# Patient Record
Sex: Female | Born: 1945
Health system: Southern US, Community
[De-identification: ages and names within clinical notes are randomized; demographics above are authoritative.]

## PROBLEM LIST (undated history)

## (undated) DIAGNOSIS — E785 Hyperlipidemia, unspecified: Secondary | ICD-10-CM

## (undated) DIAGNOSIS — E079 Disorder of thyroid, unspecified: Secondary | ICD-10-CM

## (undated) DIAGNOSIS — I219 Acute myocardial infarction, unspecified: Secondary | ICD-10-CM

## (undated) DIAGNOSIS — I10 Essential (primary) hypertension: Secondary | ICD-10-CM

## (undated) DIAGNOSIS — T7840XA Allergy, unspecified, initial encounter: Secondary | ICD-10-CM

## (undated) HISTORY — PX: CHOLECYSTECTOMY: SHX55

## (undated) HISTORY — DX: Acute myocardial infarction, unspecified: I21.9

## (undated) HISTORY — PX: CARDIAC DEFIBRILLATOR PLACEMENT: SHX171

## (undated) HISTORY — DX: Essential (primary) hypertension: I10

## (undated) HISTORY — PX: APPENDECTOMY: SHX54

## (undated) HISTORY — DX: Hyperlipidemia, unspecified: E78.5

## (undated) HISTORY — DX: Disorder of thyroid, unspecified: E07.9

## (undated) HISTORY — PX: CORONARY ARTERY BYPASS GRAFT: SHX141

## (undated) HISTORY — DX: Allergy, unspecified, initial encounter: T78.40XA

## (undated) HISTORY — PX: ABDOMINAL HYSTERECTOMY: SHX81

---

## 2014-11-17 DIAGNOSIS — I209 Angina pectoris, unspecified: Secondary | ICD-10-CM | POA: Diagnosis not present

## 2014-11-17 DIAGNOSIS — I252 Old myocardial infarction: Secondary | ICD-10-CM | POA: Diagnosis not present

## 2014-11-17 DIAGNOSIS — I5033 Acute on chronic diastolic (congestive) heart failure: Secondary | ICD-10-CM | POA: Diagnosis not present

## 2014-11-17 DIAGNOSIS — I2510 Atherosclerotic heart disease of native coronary artery without angina pectoris: Secondary | ICD-10-CM | POA: Diagnosis not present

## 2014-11-17 DIAGNOSIS — R0789 Other chest pain: Secondary | ICD-10-CM | POA: Diagnosis not present

## 2014-11-17 DIAGNOSIS — J9811 Atelectasis: Secondary | ICD-10-CM | POA: Diagnosis not present

## 2014-11-17 DIAGNOSIS — I5042 Chronic combined systolic (congestive) and diastolic (congestive) heart failure: Secondary | ICD-10-CM | POA: Diagnosis not present

## 2014-11-17 DIAGNOSIS — I10 Essential (primary) hypertension: Secondary | ICD-10-CM | POA: Diagnosis not present

## 2014-11-17 DIAGNOSIS — Z885 Allergy status to narcotic agent status: Secondary | ICD-10-CM | POA: Diagnosis not present

## 2014-11-17 DIAGNOSIS — Z955 Presence of coronary angioplasty implant and graft: Secondary | ICD-10-CM | POA: Diagnosis not present

## 2014-11-17 DIAGNOSIS — E669 Obesity, unspecified: Secondary | ICD-10-CM | POA: Diagnosis not present

## 2014-11-17 DIAGNOSIS — R110 Nausea: Secondary | ICD-10-CM | POA: Diagnosis not present

## 2014-11-17 DIAGNOSIS — R0602 Shortness of breath: Secondary | ICD-10-CM | POA: Diagnosis not present

## 2014-11-17 DIAGNOSIS — Z87891 Personal history of nicotine dependence: Secondary | ICD-10-CM | POA: Diagnosis not present

## 2014-11-17 DIAGNOSIS — Z7982 Long term (current) use of aspirin: Secondary | ICD-10-CM | POA: Diagnosis not present

## 2014-11-17 DIAGNOSIS — I25119 Atherosclerotic heart disease of native coronary artery with unspecified angina pectoris: Secondary | ICD-10-CM | POA: Diagnosis not present

## 2014-11-17 DIAGNOSIS — K219 Gastro-esophageal reflux disease without esophagitis: Secondary | ICD-10-CM | POA: Diagnosis not present

## 2014-11-18 DIAGNOSIS — I2510 Atherosclerotic heart disease of native coronary artery without angina pectoris: Secondary | ICD-10-CM | POA: Diagnosis not present

## 2014-11-18 DIAGNOSIS — I5042 Chronic combined systolic (congestive) and diastolic (congestive) heart failure: Secondary | ICD-10-CM | POA: Diagnosis not present

## 2014-11-18 DIAGNOSIS — E669 Obesity, unspecified: Secondary | ICD-10-CM | POA: Diagnosis not present

## 2014-11-18 DIAGNOSIS — I10 Essential (primary) hypertension: Secondary | ICD-10-CM | POA: Diagnosis not present

## 2014-11-18 DIAGNOSIS — K219 Gastro-esophageal reflux disease without esophagitis: Secondary | ICD-10-CM | POA: Diagnosis not present

## 2014-11-18 DIAGNOSIS — Z7982 Long term (current) use of aspirin: Secondary | ICD-10-CM | POA: Diagnosis not present

## 2014-11-18 DIAGNOSIS — Z955 Presence of coronary angioplasty implant and graft: Secondary | ICD-10-CM | POA: Diagnosis not present

## 2014-11-18 DIAGNOSIS — Z885 Allergy status to narcotic agent status: Secondary | ICD-10-CM | POA: Diagnosis not present

## 2014-11-18 DIAGNOSIS — I25119 Atherosclerotic heart disease of native coronary artery with unspecified angina pectoris: Secondary | ICD-10-CM | POA: Diagnosis not present

## 2014-11-18 DIAGNOSIS — R0789 Other chest pain: Secondary | ICD-10-CM | POA: Diagnosis not present

## 2014-11-18 DIAGNOSIS — Z87891 Personal history of nicotine dependence: Secondary | ICD-10-CM | POA: Diagnosis not present

## 2014-11-18 DIAGNOSIS — I252 Old myocardial infarction: Secondary | ICD-10-CM | POA: Diagnosis not present

## 2014-11-18 DIAGNOSIS — R0600 Dyspnea, unspecified: Secondary | ICD-10-CM | POA: Diagnosis not present

## 2014-11-18 DIAGNOSIS — E039 Hypothyroidism, unspecified: Secondary | ICD-10-CM | POA: Diagnosis not present

## 2014-11-19 DIAGNOSIS — Z7982 Long term (current) use of aspirin: Secondary | ICD-10-CM | POA: Diagnosis not present

## 2014-11-19 DIAGNOSIS — I5042 Chronic combined systolic (congestive) and diastolic (congestive) heart failure: Secondary | ICD-10-CM | POA: Diagnosis not present

## 2014-11-19 DIAGNOSIS — E669 Obesity, unspecified: Secondary | ICD-10-CM | POA: Diagnosis not present

## 2014-11-19 DIAGNOSIS — I2510 Atherosclerotic heart disease of native coronary artery without angina pectoris: Secondary | ICD-10-CM | POA: Diagnosis not present

## 2014-11-19 DIAGNOSIS — I25119 Atherosclerotic heart disease of native coronary artery with unspecified angina pectoris: Secondary | ICD-10-CM | POA: Diagnosis not present

## 2014-11-19 DIAGNOSIS — R072 Precordial pain: Secondary | ICD-10-CM | POA: Diagnosis not present

## 2014-11-19 DIAGNOSIS — Z885 Allergy status to narcotic agent status: Secondary | ICD-10-CM | POA: Diagnosis not present

## 2014-11-19 DIAGNOSIS — Z87891 Personal history of nicotine dependence: Secondary | ICD-10-CM | POA: Diagnosis not present

## 2014-11-19 DIAGNOSIS — K219 Gastro-esophageal reflux disease without esophagitis: Secondary | ICD-10-CM | POA: Diagnosis not present

## 2014-11-19 DIAGNOSIS — I252 Old myocardial infarction: Secondary | ICD-10-CM | POA: Diagnosis not present

## 2014-11-19 DIAGNOSIS — Z955 Presence of coronary angioplasty implant and graft: Secondary | ICD-10-CM | POA: Diagnosis not present

## 2014-12-20 DIAGNOSIS — H52209 Unspecified astigmatism, unspecified eye: Secondary | ICD-10-CM | POA: Diagnosis not present

## 2014-12-20 DIAGNOSIS — Z95810 Presence of automatic (implantable) cardiac defibrillator: Secondary | ICD-10-CM | POA: Diagnosis not present

## 2014-12-20 DIAGNOSIS — Z0100 Encounter for examination of eyes and vision without abnormal findings: Secondary | ICD-10-CM | POA: Diagnosis not present

## 2014-12-20 DIAGNOSIS — H524 Presbyopia: Secondary | ICD-10-CM | POA: Diagnosis not present

## 2014-12-20 DIAGNOSIS — I429 Cardiomyopathy, unspecified: Secondary | ICD-10-CM | POA: Diagnosis not present

## 2014-12-20 DIAGNOSIS — H5203 Hypermetropia, bilateral: Secondary | ICD-10-CM | POA: Diagnosis not present

## 2014-12-20 DIAGNOSIS — H5210 Myopia, unspecified eye: Secondary | ICD-10-CM | POA: Diagnosis not present

## 2014-12-21 DIAGNOSIS — J441 Chronic obstructive pulmonary disease with (acute) exacerbation: Secondary | ICD-10-CM | POA: Diagnosis not present

## 2014-12-21 DIAGNOSIS — Z6835 Body mass index (BMI) 35.0-35.9, adult: Secondary | ICD-10-CM | POA: Diagnosis not present

## 2014-12-21 DIAGNOSIS — R05 Cough: Secondary | ICD-10-CM | POA: Diagnosis not present

## 2014-12-24 DIAGNOSIS — J449 Chronic obstructive pulmonary disease, unspecified: Secondary | ICD-10-CM | POA: Diagnosis not present

## 2014-12-24 DIAGNOSIS — Z6835 Body mass index (BMI) 35.0-35.9, adult: Secondary | ICD-10-CM | POA: Diagnosis not present

## 2015-01-03 DIAGNOSIS — J449 Chronic obstructive pulmonary disease, unspecified: Secondary | ICD-10-CM | POA: Diagnosis not present

## 2015-01-03 DIAGNOSIS — E039 Hypothyroidism, unspecified: Secondary | ICD-10-CM | POA: Diagnosis not present

## 2015-01-03 DIAGNOSIS — Z6835 Body mass index (BMI) 35.0-35.9, adult: Secondary | ICD-10-CM | POA: Diagnosis not present

## 2015-01-03 DIAGNOSIS — E780 Pure hypercholesterolemia: Secondary | ICD-10-CM | POA: Diagnosis not present

## 2015-01-03 DIAGNOSIS — I10 Essential (primary) hypertension: Secondary | ICD-10-CM | POA: Diagnosis not present

## 2015-01-05 DIAGNOSIS — K219 Gastro-esophageal reflux disease without esophagitis: Secondary | ICD-10-CM | POA: Diagnosis not present

## 2015-01-05 DIAGNOSIS — J449 Chronic obstructive pulmonary disease, unspecified: Secondary | ICD-10-CM | POA: Diagnosis not present

## 2015-01-05 DIAGNOSIS — I252 Old myocardial infarction: Secondary | ICD-10-CM | POA: Diagnosis not present

## 2015-01-05 DIAGNOSIS — E785 Hyperlipidemia, unspecified: Secondary | ICD-10-CM | POA: Diagnosis not present

## 2015-01-05 DIAGNOSIS — I2510 Atherosclerotic heart disease of native coronary artery without angina pectoris: Secondary | ICD-10-CM | POA: Diagnosis not present

## 2015-01-05 DIAGNOSIS — R1013 Epigastric pain: Secondary | ICD-10-CM | POA: Diagnosis not present

## 2015-01-05 DIAGNOSIS — I739 Peripheral vascular disease, unspecified: Secondary | ICD-10-CM | POA: Diagnosis not present

## 2015-01-05 DIAGNOSIS — R079 Chest pain, unspecified: Secondary | ICD-10-CM | POA: Diagnosis not present

## 2015-01-05 DIAGNOSIS — R05 Cough: Secondary | ICD-10-CM | POA: Diagnosis not present

## 2015-01-05 DIAGNOSIS — I10 Essential (primary) hypertension: Secondary | ICD-10-CM | POA: Diagnosis not present

## 2015-01-05 DIAGNOSIS — R110 Nausea: Secondary | ICD-10-CM | POA: Diagnosis not present

## 2015-01-05 DIAGNOSIS — E669 Obesity, unspecified: Secondary | ICD-10-CM | POA: Diagnosis not present

## 2015-01-06 DIAGNOSIS — K219 Gastro-esophageal reflux disease without esophagitis: Secondary | ICD-10-CM | POA: Diagnosis not present

## 2015-01-06 DIAGNOSIS — R079 Chest pain, unspecified: Secondary | ICD-10-CM | POA: Diagnosis not present

## 2015-01-06 DIAGNOSIS — I252 Old myocardial infarction: Secondary | ICD-10-CM | POA: Diagnosis not present

## 2015-01-06 DIAGNOSIS — R05 Cough: Secondary | ICD-10-CM | POA: Diagnosis not present

## 2015-01-06 DIAGNOSIS — I2510 Atherosclerotic heart disease of native coronary artery without angina pectoris: Secondary | ICD-10-CM | POA: Diagnosis not present

## 2015-01-06 DIAGNOSIS — E785 Hyperlipidemia, unspecified: Secondary | ICD-10-CM | POA: Diagnosis not present

## 2015-01-06 DIAGNOSIS — R1013 Epigastric pain: Secondary | ICD-10-CM | POA: Diagnosis not present

## 2015-01-06 DIAGNOSIS — I10 Essential (primary) hypertension: Secondary | ICD-10-CM | POA: Diagnosis not present

## 2015-01-06 DIAGNOSIS — I739 Peripheral vascular disease, unspecified: Secondary | ICD-10-CM | POA: Diagnosis not present

## 2015-01-06 DIAGNOSIS — J449 Chronic obstructive pulmonary disease, unspecified: Secondary | ICD-10-CM | POA: Diagnosis not present

## 2015-01-06 DIAGNOSIS — E669 Obesity, unspecified: Secondary | ICD-10-CM | POA: Diagnosis not present

## 2015-03-17 DIAGNOSIS — Z6834 Body mass index (BMI) 34.0-34.9, adult: Secondary | ICD-10-CM | POA: Diagnosis not present

## 2015-03-17 DIAGNOSIS — I509 Heart failure, unspecified: Secondary | ICD-10-CM | POA: Diagnosis not present

## 2015-03-17 DIAGNOSIS — N183 Chronic kidney disease, stage 3 (moderate): Secondary | ICD-10-CM | POA: Diagnosis not present

## 2015-03-17 DIAGNOSIS — Z9861 Coronary angioplasty status: Secondary | ICD-10-CM | POA: Diagnosis not present

## 2015-03-17 DIAGNOSIS — E6609 Other obesity due to excess calories: Secondary | ICD-10-CM | POA: Diagnosis not present

## 2015-03-17 DIAGNOSIS — Z955 Presence of coronary angioplasty implant and graft: Secondary | ICD-10-CM | POA: Diagnosis not present

## 2015-03-23 DIAGNOSIS — Z95810 Presence of automatic (implantable) cardiac defibrillator: Secondary | ICD-10-CM | POA: Diagnosis not present

## 2015-03-23 DIAGNOSIS — I429 Cardiomyopathy, unspecified: Secondary | ICD-10-CM | POA: Diagnosis not present

## 2015-04-04 DIAGNOSIS — F419 Anxiety disorder, unspecified: Secondary | ICD-10-CM | POA: Diagnosis not present

## 2015-04-04 DIAGNOSIS — I739 Peripheral vascular disease, unspecified: Secondary | ICD-10-CM | POA: Diagnosis not present

## 2015-04-04 DIAGNOSIS — Z1389 Encounter for screening for other disorder: Secondary | ICD-10-CM | POA: Diagnosis not present

## 2015-04-04 DIAGNOSIS — G4700 Insomnia, unspecified: Secondary | ICD-10-CM | POA: Diagnosis not present

## 2015-04-04 DIAGNOSIS — E039 Hypothyroidism, unspecified: Secondary | ICD-10-CM | POA: Diagnosis not present

## 2015-04-04 DIAGNOSIS — I10 Essential (primary) hypertension: Secondary | ICD-10-CM | POA: Diagnosis not present

## 2015-04-04 DIAGNOSIS — J449 Chronic obstructive pulmonary disease, unspecified: Secondary | ICD-10-CM | POA: Diagnosis not present

## 2015-04-04 DIAGNOSIS — E780 Pure hypercholesterolemia: Secondary | ICD-10-CM | POA: Diagnosis not present

## 2015-06-23 DIAGNOSIS — Z95810 Presence of automatic (implantable) cardiac defibrillator: Secondary | ICD-10-CM | POA: Diagnosis not present

## 2015-06-23 DIAGNOSIS — I429 Cardiomyopathy, unspecified: Secondary | ICD-10-CM | POA: Diagnosis not present

## 2015-06-23 DIAGNOSIS — Z45018 Encounter for adjustment and management of other part of cardiac pacemaker: Secondary | ICD-10-CM | POA: Diagnosis not present

## 2015-06-30 DIAGNOSIS — Z9861 Coronary angioplasty status: Secondary | ICD-10-CM | POA: Diagnosis not present

## 2015-06-30 DIAGNOSIS — I083 Combined rheumatic disorders of mitral, aortic and tricuspid valves: Secondary | ICD-10-CM | POA: Diagnosis not present

## 2015-06-30 DIAGNOSIS — Z87891 Personal history of nicotine dependence: Secondary | ICD-10-CM | POA: Diagnosis not present

## 2015-06-30 DIAGNOSIS — R109 Unspecified abdominal pain: Secondary | ICD-10-CM | POA: Diagnosis not present

## 2015-06-30 DIAGNOSIS — N183 Chronic kidney disease, stage 3 (moderate): Secondary | ICD-10-CM | POA: Diagnosis not present

## 2015-06-30 DIAGNOSIS — I25119 Atherosclerotic heart disease of native coronary artery with unspecified angina pectoris: Secondary | ICD-10-CM | POA: Diagnosis not present

## 2015-06-30 DIAGNOSIS — I252 Old myocardial infarction: Secondary | ICD-10-CM | POA: Diagnosis not present

## 2015-06-30 DIAGNOSIS — Z7982 Long term (current) use of aspirin: Secondary | ICD-10-CM | POA: Diagnosis not present

## 2015-06-30 DIAGNOSIS — Z955 Presence of coronary angioplasty implant and graft: Secondary | ICD-10-CM | POA: Diagnosis not present

## 2015-06-30 DIAGNOSIS — I509 Heart failure, unspecified: Secondary | ICD-10-CM | POA: Diagnosis not present

## 2015-08-03 DIAGNOSIS — I739 Peripheral vascular disease, unspecified: Secondary | ICD-10-CM | POA: Diagnosis not present

## 2015-08-03 DIAGNOSIS — Z955 Presence of coronary angioplasty implant and graft: Secondary | ICD-10-CM | POA: Diagnosis not present

## 2015-08-03 DIAGNOSIS — Z95810 Presence of automatic (implantable) cardiac defibrillator: Secondary | ICD-10-CM | POA: Diagnosis not present

## 2015-08-03 DIAGNOSIS — Z1389 Encounter for screening for other disorder: Secondary | ICD-10-CM | POA: Diagnosis not present

## 2015-08-03 DIAGNOSIS — Z9181 History of falling: Secondary | ICD-10-CM | POA: Diagnosis not present

## 2015-08-03 DIAGNOSIS — I10 Essential (primary) hypertension: Secondary | ICD-10-CM | POA: Diagnosis not present

## 2015-08-03 DIAGNOSIS — J449 Chronic obstructive pulmonary disease, unspecified: Secondary | ICD-10-CM | POA: Diagnosis not present

## 2015-08-03 DIAGNOSIS — I2510 Atherosclerotic heart disease of native coronary artery without angina pectoris: Secondary | ICD-10-CM | POA: Diagnosis not present

## 2015-08-04 DIAGNOSIS — E780 Pure hypercholesterolemia: Secondary | ICD-10-CM | POA: Diagnosis not present

## 2015-08-04 DIAGNOSIS — E039 Hypothyroidism, unspecified: Secondary | ICD-10-CM | POA: Diagnosis not present

## 2015-09-23 DIAGNOSIS — Z95810 Presence of automatic (implantable) cardiac defibrillator: Secondary | ICD-10-CM | POA: Diagnosis not present

## 2015-09-23 DIAGNOSIS — I429 Cardiomyopathy, unspecified: Secondary | ICD-10-CM | POA: Diagnosis not present

## 2015-11-02 DIAGNOSIS — I2510 Atherosclerotic heart disease of native coronary artery without angina pectoris: Secondary | ICD-10-CM | POA: Diagnosis not present

## 2015-11-02 DIAGNOSIS — I10 Essential (primary) hypertension: Secondary | ICD-10-CM | POA: Diagnosis not present

## 2015-11-02 DIAGNOSIS — J449 Chronic obstructive pulmonary disease, unspecified: Secondary | ICD-10-CM | POA: Diagnosis not present

## 2015-11-02 DIAGNOSIS — E039 Hypothyroidism, unspecified: Secondary | ICD-10-CM | POA: Diagnosis not present

## 2015-11-02 DIAGNOSIS — Z6831 Body mass index (BMI) 31.0-31.9, adult: Secondary | ICD-10-CM | POA: Diagnosis not present

## 2015-12-22 DIAGNOSIS — Z95810 Presence of automatic (implantable) cardiac defibrillator: Secondary | ICD-10-CM | POA: Diagnosis not present

## 2015-12-22 DIAGNOSIS — I429 Cardiomyopathy, unspecified: Secondary | ICD-10-CM | POA: Diagnosis not present

## 2015-12-29 DIAGNOSIS — I519 Heart disease, unspecified: Secondary | ICD-10-CM | POA: Diagnosis not present

## 2015-12-29 DIAGNOSIS — Z87891 Personal history of nicotine dependence: Secondary | ICD-10-CM | POA: Diagnosis not present

## 2015-12-29 DIAGNOSIS — N183 Chronic kidney disease, stage 3 (moderate): Secondary | ICD-10-CM | POA: Diagnosis not present

## 2015-12-29 DIAGNOSIS — I110 Hypertensive heart disease with heart failure: Secondary | ICD-10-CM | POA: Diagnosis not present

## 2015-12-29 DIAGNOSIS — I252 Old myocardial infarction: Secondary | ICD-10-CM | POA: Diagnosis not present

## 2015-12-29 DIAGNOSIS — Z7982 Long term (current) use of aspirin: Secondary | ICD-10-CM | POA: Diagnosis not present

## 2015-12-29 DIAGNOSIS — I129 Hypertensive chronic kidney disease with stage 1 through stage 4 chronic kidney disease, or unspecified chronic kidney disease: Secondary | ICD-10-CM | POA: Diagnosis not present

## 2015-12-29 DIAGNOSIS — I10 Essential (primary) hypertension: Secondary | ICD-10-CM | POA: Diagnosis not present

## 2015-12-29 DIAGNOSIS — I2510 Atherosclerotic heart disease of native coronary artery without angina pectoris: Secondary | ICD-10-CM | POA: Diagnosis not present

## 2015-12-29 DIAGNOSIS — Z79899 Other long term (current) drug therapy: Secondary | ICD-10-CM | POA: Diagnosis not present

## 2015-12-29 DIAGNOSIS — I509 Heart failure, unspecified: Secondary | ICD-10-CM | POA: Diagnosis not present

## 2016-01-31 DIAGNOSIS — Z0000 Encounter for general adult medical examination without abnormal findings: Secondary | ICD-10-CM | POA: Diagnosis not present

## 2016-01-31 DIAGNOSIS — Z139 Encounter for screening, unspecified: Secondary | ICD-10-CM | POA: Diagnosis not present

## 2016-01-31 DIAGNOSIS — R7309 Other abnormal glucose: Secondary | ICD-10-CM | POA: Diagnosis not present

## 2016-01-31 DIAGNOSIS — Z6831 Body mass index (BMI) 31.0-31.9, adult: Secondary | ICD-10-CM | POA: Diagnosis not present

## 2016-01-31 DIAGNOSIS — Z9181 History of falling: Secondary | ICD-10-CM | POA: Diagnosis not present

## 2016-05-02 DIAGNOSIS — E784 Other hyperlipidemia: Secondary | ICD-10-CM | POA: Diagnosis not present

## 2016-05-02 DIAGNOSIS — E039 Hypothyroidism, unspecified: Secondary | ICD-10-CM | POA: Diagnosis not present

## 2016-05-02 DIAGNOSIS — I110 Hypertensive heart disease with heart failure: Secondary | ICD-10-CM | POA: Diagnosis not present

## 2016-05-02 DIAGNOSIS — K219 Gastro-esophageal reflux disease without esophagitis: Secondary | ICD-10-CM | POA: Diagnosis not present

## 2016-06-19 DIAGNOSIS — I110 Hypertensive heart disease with heart failure: Secondary | ICD-10-CM | POA: Diagnosis not present

## 2016-06-19 DIAGNOSIS — E784 Other hyperlipidemia: Secondary | ICD-10-CM | POA: Diagnosis not present

## 2016-06-19 DIAGNOSIS — Z1389 Encounter for screening for other disorder: Secondary | ICD-10-CM | POA: Diagnosis not present

## 2016-06-19 DIAGNOSIS — Z130 Encounter for screening for diseases of the blood and blood-forming organs and certain disorders involving the immune mechanism: Secondary | ICD-10-CM | POA: Diagnosis not present

## 2016-06-19 DIAGNOSIS — E039 Hypothyroidism, unspecified: Secondary | ICD-10-CM | POA: Diagnosis not present

## 2016-06-21 DIAGNOSIS — I5022 Chronic systolic (congestive) heart failure: Secondary | ICD-10-CM | POA: Diagnosis not present

## 2016-07-03 DIAGNOSIS — Z139 Encounter for screening, unspecified: Secondary | ICD-10-CM | POA: Diagnosis not present

## 2016-07-03 DIAGNOSIS — I110 Hypertensive heart disease with heart failure: Secondary | ICD-10-CM | POA: Diagnosis not present

## 2016-07-03 DIAGNOSIS — E039 Hypothyroidism, unspecified: Secondary | ICD-10-CM | POA: Diagnosis not present

## 2016-07-03 DIAGNOSIS — K5909 Other constipation: Secondary | ICD-10-CM | POA: Diagnosis not present

## 2016-07-03 DIAGNOSIS — Z1389 Encounter for screening for other disorder: Secondary | ICD-10-CM | POA: Diagnosis not present

## 2016-07-03 DIAGNOSIS — E784 Other hyperlipidemia: Secondary | ICD-10-CM | POA: Diagnosis not present

## 2016-07-03 DIAGNOSIS — Z0000 Encounter for general adult medical examination without abnormal findings: Secondary | ICD-10-CM | POA: Diagnosis not present

## 2016-08-09 DIAGNOSIS — Z955 Presence of coronary angioplasty implant and graft: Secondary | ICD-10-CM | POA: Diagnosis not present

## 2016-10-17 DIAGNOSIS — E039 Hypothyroidism, unspecified: Secondary | ICD-10-CM | POA: Diagnosis not present

## 2016-10-17 DIAGNOSIS — I110 Hypertensive heart disease with heart failure: Secondary | ICD-10-CM | POA: Diagnosis not present

## 2016-10-17 DIAGNOSIS — E784 Other hyperlipidemia: Secondary | ICD-10-CM | POA: Diagnosis not present

## 2016-10-23 DIAGNOSIS — I110 Hypertensive heart disease with heart failure: Secondary | ICD-10-CM | POA: Diagnosis not present

## 2016-10-23 DIAGNOSIS — E039 Hypothyroidism, unspecified: Secondary | ICD-10-CM | POA: Diagnosis not present

## 2016-10-23 DIAGNOSIS — Z6832 Body mass index (BMI) 32.0-32.9, adult: Secondary | ICD-10-CM | POA: Diagnosis not present

## 2016-10-23 DIAGNOSIS — E784 Other hyperlipidemia: Secondary | ICD-10-CM | POA: Diagnosis not present

## 2016-11-09 DIAGNOSIS — I429 Cardiomyopathy, unspecified: Secondary | ICD-10-CM | POA: Diagnosis not present

## 2016-11-09 DIAGNOSIS — Z95810 Presence of automatic (implantable) cardiac defibrillator: Secondary | ICD-10-CM | POA: Diagnosis not present

## 2016-11-09 DIAGNOSIS — I5022 Chronic systolic (congestive) heart failure: Secondary | ICD-10-CM | POA: Diagnosis not present

## 2017-02-08 DIAGNOSIS — I5022 Chronic systolic (congestive) heart failure: Secondary | ICD-10-CM | POA: Diagnosis not present

## 2017-04-23 DIAGNOSIS — R1012 Left upper quadrant pain: Secondary | ICD-10-CM | POA: Diagnosis not present

## 2017-04-23 DIAGNOSIS — K529 Noninfective gastroenteritis and colitis, unspecified: Secondary | ICD-10-CM | POA: Diagnosis not present

## 2017-04-23 DIAGNOSIS — I110 Hypertensive heart disease with heart failure: Secondary | ICD-10-CM | POA: Diagnosis not present

## 2017-04-23 DIAGNOSIS — I10 Essential (primary) hypertension: Secondary | ICD-10-CM | POA: Diagnosis not present

## 2017-04-23 DIAGNOSIS — R1110 Vomiting, unspecified: Secondary | ICD-10-CM | POA: Diagnosis not present

## 2017-04-23 DIAGNOSIS — Z955 Presence of coronary angioplasty implant and graft: Secondary | ICD-10-CM | POA: Diagnosis not present

## 2017-04-23 DIAGNOSIS — E784 Other hyperlipidemia: Secondary | ICD-10-CM | POA: Diagnosis not present

## 2017-04-23 DIAGNOSIS — Z79899 Other long term (current) drug therapy: Secondary | ICD-10-CM | POA: Diagnosis not present

## 2017-04-23 DIAGNOSIS — I252 Old myocardial infarction: Secondary | ICD-10-CM | POA: Diagnosis not present

## 2017-04-23 DIAGNOSIS — E039 Hypothyroidism, unspecified: Secondary | ICD-10-CM | POA: Diagnosis not present

## 2017-04-23 DIAGNOSIS — R197 Diarrhea, unspecified: Secondary | ICD-10-CM | POA: Diagnosis not present

## 2017-04-23 DIAGNOSIS — Z885 Allergy status to narcotic agent status: Secondary | ICD-10-CM | POA: Diagnosis not present

## 2017-04-23 DIAGNOSIS — R112 Nausea with vomiting, unspecified: Secondary | ICD-10-CM | POA: Diagnosis not present

## 2017-04-26 DIAGNOSIS — Z6832 Body mass index (BMI) 32.0-32.9, adult: Secondary | ICD-10-CM | POA: Diagnosis not present

## 2017-04-26 DIAGNOSIS — F3160 Bipolar disorder, current episode mixed, unspecified: Secondary | ICD-10-CM | POA: Diagnosis not present

## 2017-04-26 DIAGNOSIS — Z139 Encounter for screening, unspecified: Secondary | ICD-10-CM | POA: Diagnosis not present

## 2017-04-26 DIAGNOSIS — M79609 Pain in unspecified limb: Secondary | ICD-10-CM | POA: Diagnosis not present

## 2017-04-26 DIAGNOSIS — R109 Unspecified abdominal pain: Secondary | ICD-10-CM | POA: Diagnosis not present

## 2017-05-02 DIAGNOSIS — E039 Hypothyroidism, unspecified: Secondary | ICD-10-CM | POA: Diagnosis not present

## 2017-05-02 DIAGNOSIS — I110 Hypertensive heart disease with heart failure: Secondary | ICD-10-CM | POA: Diagnosis not present

## 2017-05-02 DIAGNOSIS — E784 Other hyperlipidemia: Secondary | ICD-10-CM | POA: Diagnosis not present

## 2017-05-02 DIAGNOSIS — Z6832 Body mass index (BMI) 32.0-32.9, adult: Secondary | ICD-10-CM | POA: Diagnosis not present

## 2017-05-10 DIAGNOSIS — I5022 Chronic systolic (congestive) heart failure: Secondary | ICD-10-CM | POA: Diagnosis not present

## 2017-07-09 DIAGNOSIS — E663 Overweight: Secondary | ICD-10-CM | POA: Diagnosis not present

## 2017-07-09 DIAGNOSIS — Z1389 Encounter for screening for other disorder: Secondary | ICD-10-CM | POA: Diagnosis not present

## 2017-07-09 DIAGNOSIS — Z139 Encounter for screening, unspecified: Secondary | ICD-10-CM | POA: Diagnosis not present

## 2017-07-09 DIAGNOSIS — Z0000 Encounter for general adult medical examination without abnormal findings: Secondary | ICD-10-CM | POA: Diagnosis not present

## 2017-07-09 DIAGNOSIS — Z789 Other specified health status: Secondary | ICD-10-CM | POA: Diagnosis not present

## 2017-08-20 DIAGNOSIS — E7849 Other hyperlipidemia: Secondary | ICD-10-CM | POA: Diagnosis not present

## 2017-08-20 DIAGNOSIS — I110 Hypertensive heart disease with heart failure: Secondary | ICD-10-CM | POA: Diagnosis not present

## 2017-08-20 DIAGNOSIS — Z23 Encounter for immunization: Secondary | ICD-10-CM | POA: Diagnosis not present

## 2017-08-27 DIAGNOSIS — I110 Hypertensive heart disease with heart failure: Secondary | ICD-10-CM | POA: Diagnosis not present

## 2017-08-27 DIAGNOSIS — Z139 Encounter for screening, unspecified: Secondary | ICD-10-CM | POA: Diagnosis not present

## 2017-08-27 DIAGNOSIS — R7309 Other abnormal glucose: Secondary | ICD-10-CM | POA: Diagnosis not present

## 2017-08-27 DIAGNOSIS — E039 Hypothyroidism, unspecified: Secondary | ICD-10-CM | POA: Diagnosis not present

## 2017-08-27 DIAGNOSIS — E7849 Other hyperlipidemia: Secondary | ICD-10-CM | POA: Diagnosis not present

## 2017-08-27 DIAGNOSIS — Z0000 Encounter for general adult medical examination without abnormal findings: Secondary | ICD-10-CM | POA: Diagnosis not present

## 2017-08-27 DIAGNOSIS — Z1331 Encounter for screening for depression: Secondary | ICD-10-CM | POA: Diagnosis not present

## 2017-09-05 DIAGNOSIS — Z1331 Encounter for screening for depression: Secondary | ICD-10-CM | POA: Diagnosis not present

## 2017-09-05 DIAGNOSIS — E039 Hypothyroidism, unspecified: Secondary | ICD-10-CM | POA: Diagnosis not present

## 2017-09-05 DIAGNOSIS — Z7189 Other specified counseling: Secondary | ICD-10-CM | POA: Diagnosis not present

## 2017-09-05 DIAGNOSIS — Z6832 Body mass index (BMI) 32.0-32.9, adult: Secondary | ICD-10-CM | POA: Diagnosis not present

## 2017-09-05 DIAGNOSIS — Z0000 Encounter for general adult medical examination without abnormal findings: Secondary | ICD-10-CM | POA: Diagnosis not present

## 2017-09-05 DIAGNOSIS — R7303 Prediabetes: Secondary | ICD-10-CM | POA: Diagnosis not present

## 2017-09-05 DIAGNOSIS — Z139 Encounter for screening, unspecified: Secondary | ICD-10-CM | POA: Diagnosis not present

## 2017-11-22 DIAGNOSIS — N3091 Cystitis, unspecified with hematuria: Secondary | ICD-10-CM | POA: Diagnosis not present

## 2017-11-22 DIAGNOSIS — N3000 Acute cystitis without hematuria: Secondary | ICD-10-CM | POA: Diagnosis not present

## 2017-12-30 DIAGNOSIS — Z130 Encounter for screening for diseases of the blood and blood-forming organs and certain disorders involving the immune mechanism: Secondary | ICD-10-CM | POA: Diagnosis not present

## 2017-12-30 DIAGNOSIS — I110 Hypertensive heart disease with heart failure: Secondary | ICD-10-CM | POA: Diagnosis not present

## 2017-12-30 DIAGNOSIS — E7849 Other hyperlipidemia: Secondary | ICD-10-CM | POA: Diagnosis not present

## 2017-12-30 DIAGNOSIS — E039 Hypothyroidism, unspecified: Secondary | ICD-10-CM | POA: Diagnosis not present

## 2018-01-07 DIAGNOSIS — N183 Chronic kidney disease, stage 3 (moderate): Secondary | ICD-10-CM | POA: Diagnosis not present

## 2018-01-07 DIAGNOSIS — I110 Hypertensive heart disease with heart failure: Secondary | ICD-10-CM | POA: Diagnosis not present

## 2018-01-07 DIAGNOSIS — R7303 Prediabetes: Secondary | ICD-10-CM | POA: Diagnosis not present

## 2018-01-07 DIAGNOSIS — Z1331 Encounter for screening for depression: Secondary | ICD-10-CM | POA: Diagnosis not present

## 2018-01-07 DIAGNOSIS — I130 Hypertensive heart and chronic kidney disease with heart failure and stage 1 through stage 4 chronic kidney disease, or unspecified chronic kidney disease: Secondary | ICD-10-CM | POA: Diagnosis not present

## 2018-01-07 DIAGNOSIS — Z139 Encounter for screening, unspecified: Secondary | ICD-10-CM | POA: Diagnosis not present

## 2018-02-05 DIAGNOSIS — I130 Hypertensive heart and chronic kidney disease with heart failure and stage 1 through stage 4 chronic kidney disease, or unspecified chronic kidney disease: Secondary | ICD-10-CM | POA: Diagnosis not present

## 2018-02-05 DIAGNOSIS — N183 Chronic kidney disease, stage 3 (moderate): Secondary | ICD-10-CM | POA: Diagnosis not present

## 2018-02-05 DIAGNOSIS — E7849 Other hyperlipidemia: Secondary | ICD-10-CM | POA: Diagnosis not present

## 2018-02-05 DIAGNOSIS — Z789 Other specified health status: Secondary | ICD-10-CM | POA: Diagnosis not present

## 2018-02-10 DIAGNOSIS — Z4502 Encounter for adjustment and management of automatic implantable cardiac defibrillator: Secondary | ICD-10-CM | POA: Diagnosis not present

## 2018-02-10 DIAGNOSIS — I429 Cardiomyopathy, unspecified: Secondary | ICD-10-CM | POA: Diagnosis not present

## 2018-02-12 DIAGNOSIS — I2510 Atherosclerotic heart disease of native coronary artery without angina pectoris: Secondary | ICD-10-CM | POA: Diagnosis not present

## 2018-02-12 DIAGNOSIS — I429 Cardiomyopathy, unspecified: Secondary | ICD-10-CM | POA: Diagnosis not present

## 2018-02-12 DIAGNOSIS — Z9861 Coronary angioplasty status: Secondary | ICD-10-CM | POA: Diagnosis not present

## 2018-02-12 DIAGNOSIS — Z95810 Presence of automatic (implantable) cardiac defibrillator: Secondary | ICD-10-CM | POA: Diagnosis not present

## 2018-02-12 DIAGNOSIS — Z7901 Long term (current) use of anticoagulants: Secondary | ICD-10-CM | POA: Diagnosis not present

## 2018-02-12 DIAGNOSIS — Z87891 Personal history of nicotine dependence: Secondary | ICD-10-CM | POA: Diagnosis not present

## 2018-02-12 DIAGNOSIS — I5022 Chronic systolic (congestive) heart failure: Secondary | ICD-10-CM | POA: Diagnosis not present

## 2018-02-12 DIAGNOSIS — Z01810 Encounter for preprocedural cardiovascular examination: Secondary | ICD-10-CM | POA: Diagnosis not present

## 2018-02-12 DIAGNOSIS — Z4502 Encounter for adjustment and management of automatic implantable cardiac defibrillator: Secondary | ICD-10-CM | POA: Diagnosis not present

## 2018-02-12 DIAGNOSIS — I5020 Unspecified systolic (congestive) heart failure: Secondary | ICD-10-CM | POA: Diagnosis not present

## 2018-02-12 DIAGNOSIS — Z7982 Long term (current) use of aspirin: Secondary | ICD-10-CM | POA: Diagnosis not present

## 2018-02-12 DIAGNOSIS — I252 Old myocardial infarction: Secondary | ICD-10-CM | POA: Diagnosis not present

## 2018-03-13 DIAGNOSIS — Z955 Presence of coronary angioplasty implant and graft: Secondary | ICD-10-CM | POA: Diagnosis not present

## 2018-03-13 DIAGNOSIS — Z87891 Personal history of nicotine dependence: Secondary | ICD-10-CM | POA: Diagnosis not present

## 2018-03-13 DIAGNOSIS — I5020 Unspecified systolic (congestive) heart failure: Secondary | ICD-10-CM | POA: Diagnosis not present

## 2018-03-13 DIAGNOSIS — I252 Old myocardial infarction: Secondary | ICD-10-CM | POA: Diagnosis not present

## 2018-03-13 DIAGNOSIS — Z4502 Encounter for adjustment and management of automatic implantable cardiac defibrillator: Secondary | ICD-10-CM | POA: Diagnosis not present

## 2018-03-13 DIAGNOSIS — Z8679 Personal history of other diseases of the circulatory system: Secondary | ICD-10-CM | POA: Diagnosis not present

## 2018-03-27 DIAGNOSIS — I5020 Unspecified systolic (congestive) heart failure: Secondary | ICD-10-CM | POA: Diagnosis not present

## 2018-03-27 DIAGNOSIS — Z45010 Encounter for checking and testing of cardiac pacemaker pulse generator [battery]: Secondary | ICD-10-CM | POA: Diagnosis not present

## 2018-03-28 DIAGNOSIS — M5430 Sciatica, unspecified side: Secondary | ICD-10-CM | POA: Diagnosis not present

## 2018-03-28 DIAGNOSIS — M545 Low back pain: Secondary | ICD-10-CM | POA: Diagnosis not present

## 2018-03-31 DIAGNOSIS — Z4502 Encounter for adjustment and management of automatic implantable cardiac defibrillator: Secondary | ICD-10-CM | POA: Diagnosis not present

## 2018-03-31 DIAGNOSIS — I429 Cardiomyopathy, unspecified: Secondary | ICD-10-CM | POA: Diagnosis not present

## 2018-04-11 DIAGNOSIS — I110 Hypertensive heart disease with heart failure: Secondary | ICD-10-CM | POA: Diagnosis not present

## 2018-04-11 DIAGNOSIS — E039 Hypothyroidism, unspecified: Secondary | ICD-10-CM | POA: Diagnosis not present

## 2018-04-11 DIAGNOSIS — N183 Chronic kidney disease, stage 3 (moderate): Secondary | ICD-10-CM | POA: Diagnosis not present

## 2018-04-29 DIAGNOSIS — E7849 Other hyperlipidemia: Secondary | ICD-10-CM | POA: Diagnosis not present

## 2018-04-29 DIAGNOSIS — I110 Hypertensive heart disease with heart failure: Secondary | ICD-10-CM | POA: Diagnosis not present

## 2018-05-06 DIAGNOSIS — I110 Hypertensive heart disease with heart failure: Secondary | ICD-10-CM | POA: Diagnosis not present

## 2018-05-06 DIAGNOSIS — E039 Hypothyroidism, unspecified: Secondary | ICD-10-CM | POA: Diagnosis not present

## 2018-05-06 DIAGNOSIS — E669 Obesity, unspecified: Secondary | ICD-10-CM | POA: Diagnosis not present

## 2018-05-06 DIAGNOSIS — R7303 Prediabetes: Secondary | ICD-10-CM | POA: Diagnosis not present

## 2018-05-06 DIAGNOSIS — E7849 Other hyperlipidemia: Secondary | ICD-10-CM | POA: Diagnosis not present

## 2018-05-11 DIAGNOSIS — I2510 Atherosclerotic heart disease of native coronary artery without angina pectoris: Secondary | ICD-10-CM | POA: Diagnosis not present

## 2018-05-11 DIAGNOSIS — E7849 Other hyperlipidemia: Secondary | ICD-10-CM | POA: Diagnosis not present

## 2018-05-11 DIAGNOSIS — E039 Hypothyroidism, unspecified: Secondary | ICD-10-CM | POA: Diagnosis not present

## 2018-05-11 DIAGNOSIS — I110 Hypertensive heart disease with heart failure: Secondary | ICD-10-CM | POA: Diagnosis not present

## 2018-06-05 DIAGNOSIS — I110 Hypertensive heart disease with heart failure: Secondary | ICD-10-CM | POA: Diagnosis not present

## 2018-06-05 DIAGNOSIS — R7303 Prediabetes: Secondary | ICD-10-CM | POA: Diagnosis not present

## 2018-06-05 DIAGNOSIS — Z139 Encounter for screening, unspecified: Secondary | ICD-10-CM | POA: Diagnosis not present

## 2018-06-05 DIAGNOSIS — Z6830 Body mass index (BMI) 30.0-30.9, adult: Secondary | ICD-10-CM | POA: Diagnosis not present

## 2018-06-11 DIAGNOSIS — E7849 Other hyperlipidemia: Secondary | ICD-10-CM | POA: Diagnosis not present

## 2018-06-11 DIAGNOSIS — N183 Chronic kidney disease, stage 3 (moderate): Secondary | ICD-10-CM | POA: Diagnosis not present

## 2018-06-11 DIAGNOSIS — I130 Hypertensive heart and chronic kidney disease with heart failure and stage 1 through stage 4 chronic kidney disease, or unspecified chronic kidney disease: Secondary | ICD-10-CM | POA: Diagnosis not present

## 2018-06-11 DIAGNOSIS — I110 Hypertensive heart disease with heart failure: Secondary | ICD-10-CM | POA: Diagnosis not present

## 2018-06-18 DIAGNOSIS — Z95810 Presence of automatic (implantable) cardiac defibrillator: Secondary | ICD-10-CM | POA: Diagnosis not present

## 2018-06-18 DIAGNOSIS — I5020 Unspecified systolic (congestive) heart failure: Secondary | ICD-10-CM | POA: Diagnosis not present

## 2018-06-18 DIAGNOSIS — I252 Old myocardial infarction: Secondary | ICD-10-CM | POA: Diagnosis not present

## 2018-06-18 DIAGNOSIS — Z4502 Encounter for adjustment and management of automatic implantable cardiac defibrillator: Secondary | ICD-10-CM | POA: Diagnosis not present

## 2018-06-19 DIAGNOSIS — Z6830 Body mass index (BMI) 30.0-30.9, adult: Secondary | ICD-10-CM | POA: Diagnosis not present

## 2018-06-19 DIAGNOSIS — R7303 Prediabetes: Secondary | ICD-10-CM | POA: Diagnosis not present

## 2018-06-19 DIAGNOSIS — N183 Chronic kidney disease, stage 3 (moderate): Secondary | ICD-10-CM | POA: Diagnosis not present

## 2018-06-19 DIAGNOSIS — I130 Hypertensive heart and chronic kidney disease with heart failure and stage 1 through stage 4 chronic kidney disease, or unspecified chronic kidney disease: Secondary | ICD-10-CM | POA: Diagnosis not present

## 2018-07-11 DIAGNOSIS — N183 Chronic kidney disease, stage 3 (moderate): Secondary | ICD-10-CM | POA: Diagnosis not present

## 2018-07-11 DIAGNOSIS — I110 Hypertensive heart disease with heart failure: Secondary | ICD-10-CM | POA: Diagnosis not present

## 2018-07-11 DIAGNOSIS — I130 Hypertensive heart and chronic kidney disease with heart failure and stage 1 through stage 4 chronic kidney disease, or unspecified chronic kidney disease: Secondary | ICD-10-CM | POA: Diagnosis not present

## 2018-07-11 DIAGNOSIS — R7303 Prediabetes: Secondary | ICD-10-CM | POA: Diagnosis not present

## 2018-07-22 DIAGNOSIS — Z0000 Encounter for general adult medical examination without abnormal findings: Secondary | ICD-10-CM | POA: Diagnosis not present

## 2018-07-22 DIAGNOSIS — Z139 Encounter for screening, unspecified: Secondary | ICD-10-CM | POA: Diagnosis not present

## 2018-07-22 DIAGNOSIS — Z23 Encounter for immunization: Secondary | ICD-10-CM | POA: Diagnosis not present

## 2018-07-22 DIAGNOSIS — Z1331 Encounter for screening for depression: Secondary | ICD-10-CM | POA: Diagnosis not present

## 2018-07-22 DIAGNOSIS — I130 Hypertensive heart and chronic kidney disease with heart failure and stage 1 through stage 4 chronic kidney disease, or unspecified chronic kidney disease: Secondary | ICD-10-CM | POA: Diagnosis not present

## 2018-07-22 DIAGNOSIS — R7303 Prediabetes: Secondary | ICD-10-CM | POA: Diagnosis not present

## 2018-07-22 DIAGNOSIS — N183 Chronic kidney disease, stage 3 (moderate): Secondary | ICD-10-CM | POA: Diagnosis not present

## 2018-08-07 DIAGNOSIS — E7849 Other hyperlipidemia: Secondary | ICD-10-CM | POA: Diagnosis not present

## 2018-08-07 DIAGNOSIS — I110 Hypertensive heart disease with heart failure: Secondary | ICD-10-CM | POA: Diagnosis not present

## 2018-08-07 DIAGNOSIS — R7303 Prediabetes: Secondary | ICD-10-CM | POA: Diagnosis not present

## 2018-08-11 DIAGNOSIS — I110 Hypertensive heart disease with heart failure: Secondary | ICD-10-CM | POA: Diagnosis not present

## 2018-08-11 DIAGNOSIS — R7303 Prediabetes: Secondary | ICD-10-CM | POA: Diagnosis not present

## 2018-08-11 DIAGNOSIS — I130 Hypertensive heart and chronic kidney disease with heart failure and stage 1 through stage 4 chronic kidney disease, or unspecified chronic kidney disease: Secondary | ICD-10-CM | POA: Diagnosis not present

## 2018-08-11 DIAGNOSIS — N183 Chronic kidney disease, stage 3 (moderate): Secondary | ICD-10-CM | POA: Diagnosis not present

## 2018-08-14 DIAGNOSIS — E669 Obesity, unspecified: Secondary | ICD-10-CM | POA: Diagnosis not present

## 2018-08-14 DIAGNOSIS — Z139 Encounter for screening, unspecified: Secondary | ICD-10-CM | POA: Diagnosis not present

## 2018-08-14 DIAGNOSIS — R7303 Prediabetes: Secondary | ICD-10-CM | POA: Diagnosis not present

## 2018-08-14 DIAGNOSIS — E7849 Other hyperlipidemia: Secondary | ICD-10-CM | POA: Diagnosis not present

## 2018-08-14 DIAGNOSIS — I110 Hypertensive heart disease with heart failure: Secondary | ICD-10-CM | POA: Diagnosis not present

## 2018-09-01 DIAGNOSIS — K219 Gastro-esophageal reflux disease without esophagitis: Secondary | ICD-10-CM | POA: Diagnosis not present

## 2018-09-01 DIAGNOSIS — N183 Chronic kidney disease, stage 3 (moderate): Secondary | ICD-10-CM | POA: Diagnosis not present

## 2018-09-01 DIAGNOSIS — I5023 Acute on chronic systolic (congestive) heart failure: Secondary | ICD-10-CM | POA: Diagnosis not present

## 2018-09-01 DIAGNOSIS — Z95810 Presence of automatic (implantable) cardiac defibrillator: Secondary | ICD-10-CM | POA: Diagnosis not present

## 2018-09-01 DIAGNOSIS — I252 Old myocardial infarction: Secondary | ICD-10-CM | POA: Diagnosis not present

## 2018-09-11 DIAGNOSIS — E039 Hypothyroidism, unspecified: Secondary | ICD-10-CM | POA: Diagnosis not present

## 2018-09-11 DIAGNOSIS — I110 Hypertensive heart disease with heart failure: Secondary | ICD-10-CM | POA: Diagnosis not present

## 2018-09-11 DIAGNOSIS — R7303 Prediabetes: Secondary | ICD-10-CM | POA: Diagnosis not present

## 2018-09-11 DIAGNOSIS — E7849 Other hyperlipidemia: Secondary | ICD-10-CM | POA: Diagnosis not present

## 2018-09-18 DIAGNOSIS — I509 Heart failure, unspecified: Secondary | ICD-10-CM | POA: Diagnosis not present

## 2018-09-18 DIAGNOSIS — Z4509 Encounter for adjustment and management of other cardiac device: Secondary | ICD-10-CM | POA: Diagnosis not present

## 2018-09-20 DIAGNOSIS — Z4502 Encounter for adjustment and management of automatic implantable cardiac defibrillator: Secondary | ICD-10-CM | POA: Diagnosis not present

## 2018-09-28 DIAGNOSIS — B029 Zoster without complications: Secondary | ICD-10-CM | POA: Diagnosis not present

## 2018-11-11 DIAGNOSIS — I110 Hypertensive heart disease with heart failure: Secondary | ICD-10-CM | POA: Diagnosis not present

## 2018-11-11 DIAGNOSIS — I130 Hypertensive heart and chronic kidney disease with heart failure and stage 1 through stage 4 chronic kidney disease, or unspecified chronic kidney disease: Secondary | ICD-10-CM | POA: Diagnosis not present

## 2018-11-11 DIAGNOSIS — R7303 Prediabetes: Secondary | ICD-10-CM | POA: Diagnosis not present

## 2018-11-11 DIAGNOSIS — E7849 Other hyperlipidemia: Secondary | ICD-10-CM | POA: Diagnosis not present

## 2018-11-22 DIAGNOSIS — J324 Chronic pansinusitis: Secondary | ICD-10-CM | POA: Diagnosis not present

## 2018-12-04 DIAGNOSIS — M549 Dorsalgia, unspecified: Secondary | ICD-10-CM | POA: Diagnosis not present

## 2018-12-04 DIAGNOSIS — M79621 Pain in right upper arm: Secondary | ICD-10-CM | POA: Diagnosis not present

## 2018-12-04 DIAGNOSIS — J209 Acute bronchitis, unspecified: Secondary | ICD-10-CM | POA: Diagnosis not present

## 2018-12-18 DIAGNOSIS — I5022 Chronic systolic (congestive) heart failure: Secondary | ICD-10-CM | POA: Diagnosis not present

## 2018-12-18 DIAGNOSIS — I130 Hypertensive heart and chronic kidney disease with heart failure and stage 1 through stage 4 chronic kidney disease, or unspecified chronic kidney disease: Secondary | ICD-10-CM | POA: Diagnosis not present

## 2018-12-18 DIAGNOSIS — R531 Weakness: Secondary | ICD-10-CM | POA: Diagnosis not present

## 2018-12-18 DIAGNOSIS — E271 Primary adrenocortical insufficiency: Secondary | ICD-10-CM | POA: Diagnosis not present

## 2018-12-18 DIAGNOSIS — D631 Anemia in chronic kidney disease: Secondary | ICD-10-CM | POA: Diagnosis not present

## 2018-12-18 DIAGNOSIS — N183 Chronic kidney disease, stage 3 (moderate): Secondary | ICD-10-CM | POA: Diagnosis not present

## 2018-12-18 DIAGNOSIS — I6521 Occlusion and stenosis of right carotid artery: Secondary | ICD-10-CM | POA: Diagnosis not present

## 2018-12-18 DIAGNOSIS — R262 Difficulty in walking, not elsewhere classified: Secondary | ICD-10-CM | POA: Diagnosis not present

## 2018-12-18 DIAGNOSIS — I252 Old myocardial infarction: Secondary | ICD-10-CM | POA: Diagnosis not present

## 2018-12-18 DIAGNOSIS — E039 Hypothyroidism, unspecified: Secondary | ICD-10-CM | POA: Diagnosis not present

## 2018-12-18 DIAGNOSIS — E871 Hypo-osmolality and hyponatremia: Secondary | ICD-10-CM | POA: Diagnosis not present

## 2018-12-18 DIAGNOSIS — R296 Repeated falls: Secondary | ICD-10-CM | POA: Diagnosis not present

## 2018-12-18 DIAGNOSIS — N189 Chronic kidney disease, unspecified: Secondary | ICD-10-CM | POA: Diagnosis not present

## 2018-12-18 DIAGNOSIS — R42 Dizziness and giddiness: Secondary | ICD-10-CM | POA: Diagnosis not present

## 2018-12-18 DIAGNOSIS — Z4502 Encounter for adjustment and management of automatic implantable cardiac defibrillator: Secondary | ICD-10-CM | POA: Diagnosis not present

## 2018-12-18 DIAGNOSIS — E649 Sequelae of unspecified nutritional deficiency: Secondary | ICD-10-CM | POA: Diagnosis not present

## 2018-12-18 DIAGNOSIS — I5020 Unspecified systolic (congestive) heart failure: Secondary | ICD-10-CM | POA: Diagnosis not present

## 2018-12-18 DIAGNOSIS — E538 Deficiency of other specified B group vitamins: Secondary | ICD-10-CM | POA: Diagnosis not present

## 2018-12-18 DIAGNOSIS — H81399 Other peripheral vertigo, unspecified ear: Secondary | ICD-10-CM | POA: Diagnosis not present

## 2018-12-18 DIAGNOSIS — I10 Essential (primary) hypertension: Secondary | ICD-10-CM | POA: Diagnosis not present

## 2018-12-18 DIAGNOSIS — I2510 Atherosclerotic heart disease of native coronary artery without angina pectoris: Secondary | ICD-10-CM | POA: Diagnosis not present

## 2018-12-18 DIAGNOSIS — I491 Atrial premature depolarization: Secondary | ICD-10-CM | POA: Diagnosis not present

## 2018-12-18 DIAGNOSIS — Z955 Presence of coronary angioplasty implant and graft: Secondary | ICD-10-CM | POA: Diagnosis not present

## 2018-12-18 DIAGNOSIS — Z95810 Presence of automatic (implantable) cardiac defibrillator: Secondary | ICD-10-CM | POA: Diagnosis not present

## 2018-12-18 DIAGNOSIS — I429 Cardiomyopathy, unspecified: Secondary | ICD-10-CM | POA: Diagnosis not present

## 2018-12-26 DIAGNOSIS — E2740 Unspecified adrenocortical insufficiency: Secondary | ICD-10-CM | POA: Diagnosis not present

## 2018-12-26 DIAGNOSIS — E039 Hypothyroidism, unspecified: Secondary | ICD-10-CM | POA: Diagnosis not present

## 2018-12-26 DIAGNOSIS — Z79899 Other long term (current) drug therapy: Secondary | ICD-10-CM | POA: Diagnosis not present

## 2019-01-12 DIAGNOSIS — E2740 Unspecified adrenocortical insufficiency: Secondary | ICD-10-CM | POA: Diagnosis not present

## 2019-01-13 DIAGNOSIS — I10 Essential (primary) hypertension: Secondary | ICD-10-CM | POA: Diagnosis not present

## 2019-01-13 DIAGNOSIS — Z6830 Body mass index (BMI) 30.0-30.9, adult: Secondary | ICD-10-CM | POA: Diagnosis not present

## 2019-01-13 DIAGNOSIS — E2740 Unspecified adrenocortical insufficiency: Secondary | ICD-10-CM | POA: Diagnosis not present

## 2019-04-04 DIAGNOSIS — M5430 Sciatica, unspecified side: Secondary | ICD-10-CM | POA: Diagnosis not present

## 2019-04-04 DIAGNOSIS — M545 Low back pain: Secondary | ICD-10-CM | POA: Diagnosis not present

## 2019-04-09 DIAGNOSIS — M5431 Sciatica, right side: Secondary | ICD-10-CM | POA: Diagnosis not present

## 2019-04-09 DIAGNOSIS — R600 Localized edema: Secondary | ICD-10-CM | POA: Diagnosis not present

## 2019-04-14 DIAGNOSIS — M545 Low back pain: Secondary | ICD-10-CM | POA: Diagnosis not present

## 2019-04-15 DIAGNOSIS — R600 Localized edema: Secondary | ICD-10-CM | POA: Diagnosis not present

## 2019-04-23 DIAGNOSIS — I5023 Acute on chronic systolic (congestive) heart failure: Secondary | ICD-10-CM | POA: Diagnosis not present

## 2019-04-23 DIAGNOSIS — R0602 Shortness of breath: Secondary | ICD-10-CM | POA: Diagnosis not present

## 2019-05-11 DIAGNOSIS — I119 Hypertensive heart disease without heart failure: Secondary | ICD-10-CM | POA: Diagnosis not present

## 2019-05-11 DIAGNOSIS — E039 Hypothyroidism, unspecified: Secondary | ICD-10-CM | POA: Diagnosis not present

## 2019-05-11 DIAGNOSIS — G4700 Insomnia, unspecified: Secondary | ICD-10-CM | POA: Diagnosis not present

## 2019-05-11 DIAGNOSIS — K219 Gastro-esophageal reflux disease without esophagitis: Secondary | ICD-10-CM | POA: Diagnosis not present

## 2019-05-11 DIAGNOSIS — I739 Peripheral vascular disease, unspecified: Secondary | ICD-10-CM | POA: Diagnosis not present

## 2019-05-11 DIAGNOSIS — Z6830 Body mass index (BMI) 30.0-30.9, adult: Secondary | ICD-10-CM | POA: Diagnosis not present

## 2019-05-11 DIAGNOSIS — Z8639 Personal history of other endocrine, nutritional and metabolic disease: Secondary | ICD-10-CM | POA: Diagnosis not present

## 2019-05-11 DIAGNOSIS — E7800 Pure hypercholesterolemia, unspecified: Secondary | ICD-10-CM | POA: Diagnosis not present

## 2019-05-11 DIAGNOSIS — R600 Localized edema: Secondary | ICD-10-CM | POA: Diagnosis not present

## 2019-05-17 DIAGNOSIS — M25552 Pain in left hip: Secondary | ICD-10-CM | POA: Diagnosis not present

## 2019-05-28 DIAGNOSIS — M25552 Pain in left hip: Secondary | ICD-10-CM | POA: Diagnosis not present

## 2019-06-01 DIAGNOSIS — I130 Hypertensive heart and chronic kidney disease with heart failure and stage 1 through stage 4 chronic kidney disease, or unspecified chronic kidney disease: Secondary | ICD-10-CM | POA: Diagnosis not present

## 2019-06-01 DIAGNOSIS — E6609 Other obesity due to excess calories: Secondary | ICD-10-CM | POA: Diagnosis not present

## 2019-06-01 DIAGNOSIS — S329XXA Fracture of unspecified parts of lumbosacral spine and pelvis, initial encounter for closed fracture: Secondary | ICD-10-CM | POA: Diagnosis not present

## 2019-06-01 DIAGNOSIS — I10 Essential (primary) hypertension: Secondary | ICD-10-CM | POA: Diagnosis not present

## 2019-06-01 DIAGNOSIS — G589 Mononeuropathy, unspecified: Secondary | ICD-10-CM | POA: Diagnosis not present

## 2019-06-01 DIAGNOSIS — M8448XA Pathological fracture, other site, initial encounter for fracture: Secondary | ICD-10-CM | POA: Diagnosis not present

## 2019-06-01 DIAGNOSIS — S32110A Nondisplaced Zone I fracture of sacrum, initial encounter for closed fracture: Secondary | ICD-10-CM | POA: Diagnosis not present

## 2019-06-01 DIAGNOSIS — M25552 Pain in left hip: Secondary | ICD-10-CM | POA: Diagnosis not present

## 2019-06-01 DIAGNOSIS — E871 Hypo-osmolality and hyponatremia: Secondary | ICD-10-CM | POA: Diagnosis not present

## 2019-06-01 DIAGNOSIS — S32512A Fracture of superior rim of left pubis, initial encounter for closed fracture: Secondary | ICD-10-CM | POA: Diagnosis not present

## 2019-06-01 DIAGNOSIS — M4316 Spondylolisthesis, lumbar region: Secondary | ICD-10-CM | POA: Diagnosis not present

## 2019-06-01 DIAGNOSIS — M5126 Other intervertebral disc displacement, lumbar region: Secondary | ICD-10-CM | POA: Diagnosis not present

## 2019-06-01 DIAGNOSIS — S32502A Unspecified fracture of left pubis, initial encounter for closed fracture: Secondary | ICD-10-CM | POA: Diagnosis not present

## 2019-06-01 DIAGNOSIS — M5410 Radiculopathy, site unspecified: Secondary | ICD-10-CM | POA: Diagnosis not present

## 2019-06-01 DIAGNOSIS — E875 Hyperkalemia: Secondary | ICD-10-CM | POA: Diagnosis not present

## 2019-06-01 DIAGNOSIS — M48061 Spinal stenosis, lumbar region without neurogenic claudication: Secondary | ICD-10-CM | POA: Diagnosis not present

## 2019-06-01 DIAGNOSIS — I5022 Chronic systolic (congestive) heart failure: Secondary | ICD-10-CM | POA: Diagnosis not present

## 2019-06-01 DIAGNOSIS — Z66 Do not resuscitate: Secondary | ICD-10-CM | POA: Diagnosis not present

## 2019-06-01 DIAGNOSIS — R001 Bradycardia, unspecified: Secondary | ICD-10-CM | POA: Diagnosis not present

## 2019-06-01 DIAGNOSIS — M47816 Spondylosis without myelopathy or radiculopathy, lumbar region: Secondary | ICD-10-CM | POA: Diagnosis not present

## 2019-06-01 DIAGNOSIS — E2749 Other adrenocortical insufficiency: Secondary | ICD-10-CM | POA: Diagnosis not present

## 2019-06-01 DIAGNOSIS — E2740 Unspecified adrenocortical insufficiency: Secondary | ICD-10-CM | POA: Diagnosis not present

## 2019-06-01 DIAGNOSIS — S32810A Multiple fractures of pelvis with stable disruption of pelvic ring, initial encounter for closed fracture: Secondary | ICD-10-CM | POA: Diagnosis not present

## 2019-06-02 DIAGNOSIS — E2740 Unspecified adrenocortical insufficiency: Secondary | ICD-10-CM | POA: Diagnosis not present

## 2019-06-02 DIAGNOSIS — E6609 Other obesity due to excess calories: Secondary | ICD-10-CM | POA: Diagnosis not present

## 2019-06-02 DIAGNOSIS — M25552 Pain in left hip: Secondary | ICD-10-CM | POA: Diagnosis not present

## 2019-06-02 DIAGNOSIS — S32810A Multiple fractures of pelvis with stable disruption of pelvic ring, initial encounter for closed fracture: Secondary | ICD-10-CM | POA: Diagnosis not present

## 2019-06-02 DIAGNOSIS — M8448XA Pathological fracture, other site, initial encounter for fracture: Secondary | ICD-10-CM | POA: Diagnosis not present

## 2019-06-02 DIAGNOSIS — E871 Hypo-osmolality and hyponatremia: Secondary | ICD-10-CM | POA: Diagnosis not present

## 2019-06-02 DIAGNOSIS — G589 Mononeuropathy, unspecified: Secondary | ICD-10-CM | POA: Diagnosis not present

## 2019-06-02 DIAGNOSIS — Z66 Do not resuscitate: Secondary | ICD-10-CM | POA: Diagnosis not present

## 2019-06-02 DIAGNOSIS — I5022 Chronic systolic (congestive) heart failure: Secondary | ICD-10-CM | POA: Diagnosis not present

## 2019-06-02 DIAGNOSIS — I130 Hypertensive heart and chronic kidney disease with heart failure and stage 1 through stage 4 chronic kidney disease, or unspecified chronic kidney disease: Secondary | ICD-10-CM | POA: Diagnosis not present

## 2019-06-02 DIAGNOSIS — E2749 Other adrenocortical insufficiency: Secondary | ICD-10-CM | POA: Diagnosis not present

## 2019-06-02 DIAGNOSIS — S3210XA Unspecified fracture of sacrum, initial encounter for closed fracture: Secondary | ICD-10-CM | POA: Diagnosis not present

## 2019-06-02 DIAGNOSIS — S32592A Other specified fracture of left pubis, initial encounter for closed fracture: Secondary | ICD-10-CM | POA: Diagnosis not present

## 2019-06-03 DIAGNOSIS — Z66 Do not resuscitate: Secondary | ICD-10-CM | POA: Diagnosis not present

## 2019-06-03 DIAGNOSIS — G589 Mononeuropathy, unspecified: Secondary | ICD-10-CM | POA: Diagnosis not present

## 2019-06-03 DIAGNOSIS — M8448XA Pathological fracture, other site, initial encounter for fracture: Secondary | ICD-10-CM | POA: Diagnosis not present

## 2019-06-03 DIAGNOSIS — E2749 Other adrenocortical insufficiency: Secondary | ICD-10-CM | POA: Diagnosis not present

## 2019-06-03 DIAGNOSIS — E2740 Unspecified adrenocortical insufficiency: Secondary | ICD-10-CM | POA: Diagnosis not present

## 2019-06-03 DIAGNOSIS — I10 Essential (primary) hypertension: Secondary | ICD-10-CM | POA: Diagnosis not present

## 2019-06-03 DIAGNOSIS — S32592A Other specified fracture of left pubis, initial encounter for closed fracture: Secondary | ICD-10-CM | POA: Diagnosis not present

## 2019-06-03 DIAGNOSIS — N183 Chronic kidney disease, stage 3 (moderate): Secondary | ICD-10-CM | POA: Diagnosis not present

## 2019-06-03 DIAGNOSIS — S32810A Multiple fractures of pelvis with stable disruption of pelvic ring, initial encounter for closed fracture: Secondary | ICD-10-CM | POA: Diagnosis not present

## 2019-06-03 DIAGNOSIS — I5022 Chronic systolic (congestive) heart failure: Secondary | ICD-10-CM | POA: Diagnosis not present

## 2019-06-03 DIAGNOSIS — I130 Hypertensive heart and chronic kidney disease with heart failure and stage 1 through stage 4 chronic kidney disease, or unspecified chronic kidney disease: Secondary | ICD-10-CM | POA: Diagnosis not present

## 2019-06-03 DIAGNOSIS — D631 Anemia in chronic kidney disease: Secondary | ICD-10-CM | POA: Diagnosis not present

## 2019-06-03 DIAGNOSIS — E871 Hypo-osmolality and hyponatremia: Secondary | ICD-10-CM | POA: Diagnosis not present

## 2019-06-03 DIAGNOSIS — E6609 Other obesity due to excess calories: Secondary | ICD-10-CM | POA: Diagnosis not present

## 2019-06-03 DIAGNOSIS — S3210XA Unspecified fracture of sacrum, initial encounter for closed fracture: Secondary | ICD-10-CM | POA: Diagnosis not present

## 2019-06-04 DIAGNOSIS — E6609 Other obesity due to excess calories: Secondary | ICD-10-CM | POA: Diagnosis not present

## 2019-06-04 DIAGNOSIS — S3289XA Fracture of other parts of pelvis, initial encounter for closed fracture: Secondary | ICD-10-CM | POA: Diagnosis not present

## 2019-06-04 DIAGNOSIS — I130 Hypertensive heart and chronic kidney disease with heart failure and stage 1 through stage 4 chronic kidney disease, or unspecified chronic kidney disease: Secondary | ICD-10-CM | POA: Diagnosis not present

## 2019-06-04 DIAGNOSIS — E871 Hypo-osmolality and hyponatremia: Secondary | ICD-10-CM | POA: Diagnosis not present

## 2019-06-04 DIAGNOSIS — G59 Mononeuropathy in diseases classified elsewhere: Secondary | ICD-10-CM | POA: Diagnosis not present

## 2019-06-04 DIAGNOSIS — N183 Chronic kidney disease, stage 3 (moderate): Secondary | ICD-10-CM | POA: Diagnosis not present

## 2019-06-04 DIAGNOSIS — G589 Mononeuropathy, unspecified: Secondary | ICD-10-CM | POA: Diagnosis not present

## 2019-06-04 DIAGNOSIS — E785 Hyperlipidemia, unspecified: Secondary | ICD-10-CM | POA: Diagnosis not present

## 2019-06-04 DIAGNOSIS — R279 Unspecified lack of coordination: Secondary | ICD-10-CM | POA: Diagnosis not present

## 2019-06-04 DIAGNOSIS — I5023 Acute on chronic systolic (congestive) heart failure: Secondary | ICD-10-CM | POA: Diagnosis not present

## 2019-06-04 DIAGNOSIS — D649 Anemia, unspecified: Secondary | ICD-10-CM | POA: Diagnosis not present

## 2019-06-04 DIAGNOSIS — Z743 Need for continuous supervision: Secondary | ICD-10-CM | POA: Diagnosis not present

## 2019-06-04 DIAGNOSIS — D631 Anemia in chronic kidney disease: Secondary | ICD-10-CM | POA: Diagnosis not present

## 2019-06-04 DIAGNOSIS — I5022 Chronic systolic (congestive) heart failure: Secondary | ICD-10-CM | POA: Diagnosis not present

## 2019-06-04 DIAGNOSIS — I2510 Atherosclerotic heart disease of native coronary artery without angina pectoris: Secondary | ICD-10-CM | POA: Diagnosis not present

## 2019-06-04 DIAGNOSIS — M6281 Muscle weakness (generalized): Secondary | ICD-10-CM | POA: Diagnosis not present

## 2019-06-04 DIAGNOSIS — S3210XA Unspecified fracture of sacrum, initial encounter for closed fracture: Secondary | ICD-10-CM | POA: Diagnosis not present

## 2019-06-04 DIAGNOSIS — R2681 Unsteadiness on feet: Secondary | ICD-10-CM | POA: Diagnosis not present

## 2019-06-04 DIAGNOSIS — I10 Essential (primary) hypertension: Secondary | ICD-10-CM | POA: Diagnosis not present

## 2019-06-04 DIAGNOSIS — S32810A Multiple fractures of pelvis with stable disruption of pelvic ring, initial encounter for closed fracture: Secondary | ICD-10-CM | POA: Diagnosis not present

## 2019-06-04 DIAGNOSIS — S32810D Multiple fractures of pelvis with stable disruption of pelvic ring, subsequent encounter for fracture with routine healing: Secondary | ICD-10-CM | POA: Diagnosis not present

## 2019-06-04 DIAGNOSIS — E2749 Other adrenocortical insufficiency: Secondary | ICD-10-CM | POA: Diagnosis not present

## 2019-06-04 DIAGNOSIS — M8448XA Pathological fracture, other site, initial encounter for fracture: Secondary | ICD-10-CM | POA: Diagnosis not present

## 2019-06-04 DIAGNOSIS — E039 Hypothyroidism, unspecified: Secondary | ICD-10-CM | POA: Diagnosis not present

## 2019-06-04 DIAGNOSIS — M79605 Pain in left leg: Secondary | ICD-10-CM | POA: Diagnosis not present

## 2019-06-04 DIAGNOSIS — M8448XG Pathological fracture, other site, subsequent encounter for fracture with delayed healing: Secondary | ICD-10-CM | POA: Diagnosis not present

## 2019-06-04 DIAGNOSIS — Z66 Do not resuscitate: Secondary | ICD-10-CM | POA: Diagnosis not present

## 2019-06-05 DIAGNOSIS — S32810D Multiple fractures of pelvis with stable disruption of pelvic ring, subsequent encounter for fracture with routine healing: Secondary | ICD-10-CM | POA: Diagnosis not present

## 2019-06-05 DIAGNOSIS — E871 Hypo-osmolality and hyponatremia: Secondary | ICD-10-CM | POA: Diagnosis not present

## 2019-06-05 DIAGNOSIS — I2510 Atherosclerotic heart disease of native coronary artery without angina pectoris: Secondary | ICD-10-CM | POA: Diagnosis not present

## 2019-06-05 DIAGNOSIS — I130 Hypertensive heart and chronic kidney disease with heart failure and stage 1 through stage 4 chronic kidney disease, or unspecified chronic kidney disease: Secondary | ICD-10-CM | POA: Diagnosis not present

## 2019-06-08 DIAGNOSIS — E871 Hypo-osmolality and hyponatremia: Secondary | ICD-10-CM | POA: Diagnosis not present

## 2019-06-08 DIAGNOSIS — M79605 Pain in left leg: Secondary | ICD-10-CM | POA: Diagnosis not present

## 2019-06-08 DIAGNOSIS — G59 Mononeuropathy in diseases classified elsewhere: Secondary | ICD-10-CM | POA: Diagnosis not present

## 2019-06-08 DIAGNOSIS — S32810D Multiple fractures of pelvis with stable disruption of pelvic ring, subsequent encounter for fracture with routine healing: Secondary | ICD-10-CM | POA: Diagnosis not present

## 2019-06-12 DIAGNOSIS — S32810D Multiple fractures of pelvis with stable disruption of pelvic ring, subsequent encounter for fracture with routine healing: Secondary | ICD-10-CM | POA: Diagnosis not present

## 2019-06-12 DIAGNOSIS — E039 Hypothyroidism, unspecified: Secondary | ICD-10-CM | POA: Diagnosis not present

## 2019-06-12 DIAGNOSIS — E871 Hypo-osmolality and hyponatremia: Secondary | ICD-10-CM | POA: Diagnosis not present

## 2019-06-12 DIAGNOSIS — I130 Hypertensive heart and chronic kidney disease with heart failure and stage 1 through stage 4 chronic kidney disease, or unspecified chronic kidney disease: Secondary | ICD-10-CM | POA: Diagnosis not present

## 2019-06-20 DIAGNOSIS — I2510 Atherosclerotic heart disease of native coronary artery without angina pectoris: Secondary | ICD-10-CM | POA: Diagnosis not present

## 2019-06-20 DIAGNOSIS — K219 Gastro-esophageal reflux disease without esophagitis: Secondary | ICD-10-CM | POA: Diagnosis not present

## 2019-06-20 DIAGNOSIS — E039 Hypothyroidism, unspecified: Secondary | ICD-10-CM | POA: Diagnosis not present

## 2019-06-20 DIAGNOSIS — S32810D Multiple fractures of pelvis with stable disruption of pelvic ring, subsequent encounter for fracture with routine healing: Secondary | ICD-10-CM | POA: Diagnosis not present

## 2019-06-20 DIAGNOSIS — F339 Major depressive disorder, recurrent, unspecified: Secondary | ICD-10-CM | POA: Diagnosis not present

## 2019-06-20 DIAGNOSIS — I5023 Acute on chronic systolic (congestive) heart failure: Secondary | ICD-10-CM | POA: Diagnosis not present

## 2019-06-20 DIAGNOSIS — N183 Chronic kidney disease, stage 3 (moderate): Secondary | ICD-10-CM | POA: Diagnosis not present

## 2019-06-20 DIAGNOSIS — E781 Pure hyperglyceridemia: Secondary | ICD-10-CM | POA: Diagnosis not present

## 2019-06-20 DIAGNOSIS — I130 Hypertensive heart and chronic kidney disease with heart failure and stage 1 through stage 4 chronic kidney disease, or unspecified chronic kidney disease: Secondary | ICD-10-CM | POA: Diagnosis not present

## 2019-06-22 DIAGNOSIS — F339 Major depressive disorder, recurrent, unspecified: Secondary | ICD-10-CM | POA: Diagnosis not present

## 2019-06-22 DIAGNOSIS — E039 Hypothyroidism, unspecified: Secondary | ICD-10-CM | POA: Diagnosis not present

## 2019-06-22 DIAGNOSIS — S32810D Multiple fractures of pelvis with stable disruption of pelvic ring, subsequent encounter for fracture with routine healing: Secondary | ICD-10-CM | POA: Diagnosis not present

## 2019-06-22 DIAGNOSIS — N183 Chronic kidney disease, stage 3 (moderate): Secondary | ICD-10-CM | POA: Diagnosis not present

## 2019-06-22 DIAGNOSIS — I2510 Atherosclerotic heart disease of native coronary artery without angina pectoris: Secondary | ICD-10-CM | POA: Diagnosis not present

## 2019-06-22 DIAGNOSIS — K219 Gastro-esophageal reflux disease without esophagitis: Secondary | ICD-10-CM | POA: Diagnosis not present

## 2019-06-22 DIAGNOSIS — I5023 Acute on chronic systolic (congestive) heart failure: Secondary | ICD-10-CM | POA: Diagnosis not present

## 2019-06-22 DIAGNOSIS — E781 Pure hyperglyceridemia: Secondary | ICD-10-CM | POA: Diagnosis not present

## 2019-06-22 DIAGNOSIS — I130 Hypertensive heart and chronic kidney disease with heart failure and stage 1 through stage 4 chronic kidney disease, or unspecified chronic kidney disease: Secondary | ICD-10-CM | POA: Diagnosis not present

## 2019-06-24 DIAGNOSIS — K219 Gastro-esophageal reflux disease without esophagitis: Secondary | ICD-10-CM | POA: Diagnosis not present

## 2019-06-24 DIAGNOSIS — E039 Hypothyroidism, unspecified: Secondary | ICD-10-CM | POA: Diagnosis not present

## 2019-06-24 DIAGNOSIS — S32810D Multiple fractures of pelvis with stable disruption of pelvic ring, subsequent encounter for fracture with routine healing: Secondary | ICD-10-CM | POA: Diagnosis not present

## 2019-06-24 DIAGNOSIS — F339 Major depressive disorder, recurrent, unspecified: Secondary | ICD-10-CM | POA: Diagnosis not present

## 2019-06-24 DIAGNOSIS — E781 Pure hyperglyceridemia: Secondary | ICD-10-CM | POA: Diagnosis not present

## 2019-06-24 DIAGNOSIS — I130 Hypertensive heart and chronic kidney disease with heart failure and stage 1 through stage 4 chronic kidney disease, or unspecified chronic kidney disease: Secondary | ICD-10-CM | POA: Diagnosis not present

## 2019-06-24 DIAGNOSIS — I2510 Atherosclerotic heart disease of native coronary artery without angina pectoris: Secondary | ICD-10-CM | POA: Diagnosis not present

## 2019-06-24 DIAGNOSIS — I5023 Acute on chronic systolic (congestive) heart failure: Secondary | ICD-10-CM | POA: Diagnosis not present

## 2019-06-24 DIAGNOSIS — N183 Chronic kidney disease, stage 3 (moderate): Secondary | ICD-10-CM | POA: Diagnosis not present

## 2019-06-25 DIAGNOSIS — R102 Pelvic and perineal pain: Secondary | ICD-10-CM | POA: Diagnosis not present

## 2019-06-25 DIAGNOSIS — Z6830 Body mass index (BMI) 30.0-30.9, adult: Secondary | ICD-10-CM | POA: Diagnosis not present

## 2019-06-29 DIAGNOSIS — E039 Hypothyroidism, unspecified: Secondary | ICD-10-CM | POA: Diagnosis not present

## 2019-06-29 DIAGNOSIS — I130 Hypertensive heart and chronic kidney disease with heart failure and stage 1 through stage 4 chronic kidney disease, or unspecified chronic kidney disease: Secondary | ICD-10-CM | POA: Diagnosis not present

## 2019-06-29 DIAGNOSIS — E781 Pure hyperglyceridemia: Secondary | ICD-10-CM | POA: Diagnosis not present

## 2019-06-29 DIAGNOSIS — F339 Major depressive disorder, recurrent, unspecified: Secondary | ICD-10-CM | POA: Diagnosis not present

## 2019-06-29 DIAGNOSIS — S32810D Multiple fractures of pelvis with stable disruption of pelvic ring, subsequent encounter for fracture with routine healing: Secondary | ICD-10-CM | POA: Diagnosis not present

## 2019-06-29 DIAGNOSIS — K219 Gastro-esophageal reflux disease without esophagitis: Secondary | ICD-10-CM | POA: Diagnosis not present

## 2019-06-29 DIAGNOSIS — I5023 Acute on chronic systolic (congestive) heart failure: Secondary | ICD-10-CM | POA: Diagnosis not present

## 2019-06-29 DIAGNOSIS — N183 Chronic kidney disease, stage 3 (moderate): Secondary | ICD-10-CM | POA: Diagnosis not present

## 2019-06-29 DIAGNOSIS — I2510 Atherosclerotic heart disease of native coronary artery without angina pectoris: Secondary | ICD-10-CM | POA: Diagnosis not present

## 2019-06-30 DIAGNOSIS — S32810D Multiple fractures of pelvis with stable disruption of pelvic ring, subsequent encounter for fracture with routine healing: Secondary | ICD-10-CM | POA: Diagnosis not present

## 2019-07-01 DIAGNOSIS — I2510 Atherosclerotic heart disease of native coronary artery without angina pectoris: Secondary | ICD-10-CM | POA: Diagnosis not present

## 2019-07-01 DIAGNOSIS — I130 Hypertensive heart and chronic kidney disease with heart failure and stage 1 through stage 4 chronic kidney disease, or unspecified chronic kidney disease: Secondary | ICD-10-CM | POA: Diagnosis not present

## 2019-07-01 DIAGNOSIS — E039 Hypothyroidism, unspecified: Secondary | ICD-10-CM | POA: Diagnosis not present

## 2019-07-01 DIAGNOSIS — S32810D Multiple fractures of pelvis with stable disruption of pelvic ring, subsequent encounter for fracture with routine healing: Secondary | ICD-10-CM | POA: Diagnosis not present

## 2019-07-01 DIAGNOSIS — N183 Chronic kidney disease, stage 3 (moderate): Secondary | ICD-10-CM | POA: Diagnosis not present

## 2019-07-01 DIAGNOSIS — K219 Gastro-esophageal reflux disease without esophagitis: Secondary | ICD-10-CM | POA: Diagnosis not present

## 2019-07-01 DIAGNOSIS — E781 Pure hyperglyceridemia: Secondary | ICD-10-CM | POA: Diagnosis not present

## 2019-07-01 DIAGNOSIS — I5023 Acute on chronic systolic (congestive) heart failure: Secondary | ICD-10-CM | POA: Diagnosis not present

## 2019-07-01 DIAGNOSIS — F339 Major depressive disorder, recurrent, unspecified: Secondary | ICD-10-CM | POA: Diagnosis not present

## 2019-07-06 DIAGNOSIS — I5023 Acute on chronic systolic (congestive) heart failure: Secondary | ICD-10-CM | POA: Diagnosis not present

## 2019-07-06 DIAGNOSIS — I130 Hypertensive heart and chronic kidney disease with heart failure and stage 1 through stage 4 chronic kidney disease, or unspecified chronic kidney disease: Secondary | ICD-10-CM | POA: Diagnosis not present

## 2019-07-06 DIAGNOSIS — F339 Major depressive disorder, recurrent, unspecified: Secondary | ICD-10-CM | POA: Diagnosis not present

## 2019-07-06 DIAGNOSIS — E781 Pure hyperglyceridemia: Secondary | ICD-10-CM | POA: Diagnosis not present

## 2019-07-06 DIAGNOSIS — E039 Hypothyroidism, unspecified: Secondary | ICD-10-CM | POA: Diagnosis not present

## 2019-07-06 DIAGNOSIS — N183 Chronic kidney disease, stage 3 (moderate): Secondary | ICD-10-CM | POA: Diagnosis not present

## 2019-07-06 DIAGNOSIS — K219 Gastro-esophageal reflux disease without esophagitis: Secondary | ICD-10-CM | POA: Diagnosis not present

## 2019-07-06 DIAGNOSIS — S32810D Multiple fractures of pelvis with stable disruption of pelvic ring, subsequent encounter for fracture with routine healing: Secondary | ICD-10-CM | POA: Diagnosis not present

## 2019-07-06 DIAGNOSIS — I2510 Atherosclerotic heart disease of native coronary artery without angina pectoris: Secondary | ICD-10-CM | POA: Diagnosis not present

## 2019-07-09 DIAGNOSIS — F339 Major depressive disorder, recurrent, unspecified: Secondary | ICD-10-CM | POA: Diagnosis not present

## 2019-07-09 DIAGNOSIS — I5023 Acute on chronic systolic (congestive) heart failure: Secondary | ICD-10-CM | POA: Diagnosis not present

## 2019-07-09 DIAGNOSIS — E781 Pure hyperglyceridemia: Secondary | ICD-10-CM | POA: Diagnosis not present

## 2019-07-09 DIAGNOSIS — K219 Gastro-esophageal reflux disease without esophagitis: Secondary | ICD-10-CM | POA: Diagnosis not present

## 2019-07-09 DIAGNOSIS — I130 Hypertensive heart and chronic kidney disease with heart failure and stage 1 through stage 4 chronic kidney disease, or unspecified chronic kidney disease: Secondary | ICD-10-CM | POA: Diagnosis not present

## 2019-07-09 DIAGNOSIS — I2510 Atherosclerotic heart disease of native coronary artery without angina pectoris: Secondary | ICD-10-CM | POA: Diagnosis not present

## 2019-07-09 DIAGNOSIS — S32810D Multiple fractures of pelvis with stable disruption of pelvic ring, subsequent encounter for fracture with routine healing: Secondary | ICD-10-CM | POA: Diagnosis not present

## 2019-07-09 DIAGNOSIS — E039 Hypothyroidism, unspecified: Secondary | ICD-10-CM | POA: Diagnosis not present

## 2019-07-09 DIAGNOSIS — R2241 Localized swelling, mass and lump, right lower limb: Secondary | ICD-10-CM | POA: Diagnosis not present

## 2019-07-09 DIAGNOSIS — N183 Chronic kidney disease, stage 3 (moderate): Secondary | ICD-10-CM | POA: Diagnosis not present

## 2019-07-10 DIAGNOSIS — R600 Localized edema: Secondary | ICD-10-CM | POA: Diagnosis not present

## 2019-07-15 DIAGNOSIS — E039 Hypothyroidism, unspecified: Secondary | ICD-10-CM | POA: Diagnosis not present

## 2019-07-15 DIAGNOSIS — I130 Hypertensive heart and chronic kidney disease with heart failure and stage 1 through stage 4 chronic kidney disease, or unspecified chronic kidney disease: Secondary | ICD-10-CM | POA: Diagnosis not present

## 2019-07-15 DIAGNOSIS — I5023 Acute on chronic systolic (congestive) heart failure: Secondary | ICD-10-CM | POA: Diagnosis not present

## 2019-07-15 DIAGNOSIS — E781 Pure hyperglyceridemia: Secondary | ICD-10-CM | POA: Diagnosis not present

## 2019-07-15 DIAGNOSIS — S32810D Multiple fractures of pelvis with stable disruption of pelvic ring, subsequent encounter for fracture with routine healing: Secondary | ICD-10-CM | POA: Diagnosis not present

## 2019-07-15 DIAGNOSIS — I2510 Atherosclerotic heart disease of native coronary artery without angina pectoris: Secondary | ICD-10-CM | POA: Diagnosis not present

## 2019-07-15 DIAGNOSIS — K219 Gastro-esophageal reflux disease without esophagitis: Secondary | ICD-10-CM | POA: Diagnosis not present

## 2019-07-15 DIAGNOSIS — N183 Chronic kidney disease, stage 3 (moderate): Secondary | ICD-10-CM | POA: Diagnosis not present

## 2019-07-15 DIAGNOSIS — F339 Major depressive disorder, recurrent, unspecified: Secondary | ICD-10-CM | POA: Diagnosis not present

## 2019-07-20 DIAGNOSIS — I5023 Acute on chronic systolic (congestive) heart failure: Secondary | ICD-10-CM | POA: Diagnosis not present

## 2019-07-20 DIAGNOSIS — K219 Gastro-esophageal reflux disease without esophagitis: Secondary | ICD-10-CM | POA: Diagnosis not present

## 2019-07-20 DIAGNOSIS — I130 Hypertensive heart and chronic kidney disease with heart failure and stage 1 through stage 4 chronic kidney disease, or unspecified chronic kidney disease: Secondary | ICD-10-CM | POA: Diagnosis not present

## 2019-07-20 DIAGNOSIS — S32810D Multiple fractures of pelvis with stable disruption of pelvic ring, subsequent encounter for fracture with routine healing: Secondary | ICD-10-CM | POA: Diagnosis not present

## 2019-07-20 DIAGNOSIS — E781 Pure hyperglyceridemia: Secondary | ICD-10-CM | POA: Diagnosis not present

## 2019-07-20 DIAGNOSIS — E039 Hypothyroidism, unspecified: Secondary | ICD-10-CM | POA: Diagnosis not present

## 2019-07-20 DIAGNOSIS — F339 Major depressive disorder, recurrent, unspecified: Secondary | ICD-10-CM | POA: Diagnosis not present

## 2019-07-20 DIAGNOSIS — N183 Chronic kidney disease, stage 3 (moderate): Secondary | ICD-10-CM | POA: Diagnosis not present

## 2019-07-20 DIAGNOSIS — I2510 Atherosclerotic heart disease of native coronary artery without angina pectoris: Secondary | ICD-10-CM | POA: Diagnosis not present

## 2019-07-22 DIAGNOSIS — Z23 Encounter for immunization: Secondary | ICD-10-CM | POA: Diagnosis not present

## 2019-07-22 DIAGNOSIS — I10 Essential (primary) hypertension: Secondary | ICD-10-CM | POA: Diagnosis not present

## 2019-07-22 DIAGNOSIS — E785 Hyperlipidemia, unspecified: Secondary | ICD-10-CM | POA: Diagnosis not present

## 2019-07-22 DIAGNOSIS — M25552 Pain in left hip: Secondary | ICD-10-CM | POA: Diagnosis not present

## 2019-07-22 DIAGNOSIS — E039 Hypothyroidism, unspecified: Secondary | ICD-10-CM | POA: Diagnosis not present

## 2019-07-22 DIAGNOSIS — I82409 Acute embolism and thrombosis of unspecified deep veins of unspecified lower extremity: Secondary | ICD-10-CM | POA: Diagnosis not present

## 2019-07-22 DIAGNOSIS — Z79899 Other long term (current) drug therapy: Secondary | ICD-10-CM | POA: Diagnosis not present

## 2019-07-22 DIAGNOSIS — Z6830 Body mass index (BMI) 30.0-30.9, adult: Secondary | ICD-10-CM | POA: Diagnosis not present

## 2019-07-24 DIAGNOSIS — M25552 Pain in left hip: Secondary | ICD-10-CM | POA: Diagnosis not present

## 2019-07-24 DIAGNOSIS — S3282XA Multiple fractures of pelvis without disruption of pelvic ring, initial encounter for closed fracture: Secondary | ICD-10-CM | POA: Diagnosis not present

## 2019-07-29 DIAGNOSIS — F339 Major depressive disorder, recurrent, unspecified: Secondary | ICD-10-CM | POA: Diagnosis not present

## 2019-07-29 DIAGNOSIS — K219 Gastro-esophageal reflux disease without esophagitis: Secondary | ICD-10-CM | POA: Diagnosis not present

## 2019-07-29 DIAGNOSIS — N183 Chronic kidney disease, stage 3 (moderate): Secondary | ICD-10-CM | POA: Diagnosis not present

## 2019-07-29 DIAGNOSIS — E039 Hypothyroidism, unspecified: Secondary | ICD-10-CM | POA: Diagnosis not present

## 2019-07-29 DIAGNOSIS — E781 Pure hyperglyceridemia: Secondary | ICD-10-CM | POA: Diagnosis not present

## 2019-07-29 DIAGNOSIS — S32810D Multiple fractures of pelvis with stable disruption of pelvic ring, subsequent encounter for fracture with routine healing: Secondary | ICD-10-CM | POA: Diagnosis not present

## 2019-07-29 DIAGNOSIS — I2510 Atherosclerotic heart disease of native coronary artery without angina pectoris: Secondary | ICD-10-CM | POA: Diagnosis not present

## 2019-07-29 DIAGNOSIS — I130 Hypertensive heart and chronic kidney disease with heart failure and stage 1 through stage 4 chronic kidney disease, or unspecified chronic kidney disease: Secondary | ICD-10-CM | POA: Diagnosis not present

## 2019-07-29 DIAGNOSIS — I5023 Acute on chronic systolic (congestive) heart failure: Secondary | ICD-10-CM | POA: Diagnosis not present

## 2019-07-30 DIAGNOSIS — I130 Hypertensive heart and chronic kidney disease with heart failure and stage 1 through stage 4 chronic kidney disease, or unspecified chronic kidney disease: Secondary | ICD-10-CM | POA: Diagnosis not present

## 2019-07-30 DIAGNOSIS — F339 Major depressive disorder, recurrent, unspecified: Secondary | ICD-10-CM | POA: Diagnosis not present

## 2019-07-30 DIAGNOSIS — S32810D Multiple fractures of pelvis with stable disruption of pelvic ring, subsequent encounter for fracture with routine healing: Secondary | ICD-10-CM | POA: Diagnosis not present

## 2019-07-30 DIAGNOSIS — I5023 Acute on chronic systolic (congestive) heart failure: Secondary | ICD-10-CM | POA: Diagnosis not present

## 2019-07-30 DIAGNOSIS — K219 Gastro-esophageal reflux disease without esophagitis: Secondary | ICD-10-CM | POA: Diagnosis not present

## 2019-07-30 DIAGNOSIS — E781 Pure hyperglyceridemia: Secondary | ICD-10-CM | POA: Diagnosis not present

## 2019-07-30 DIAGNOSIS — I2510 Atherosclerotic heart disease of native coronary artery without angina pectoris: Secondary | ICD-10-CM | POA: Diagnosis not present

## 2019-07-30 DIAGNOSIS — N183 Chronic kidney disease, stage 3 (moderate): Secondary | ICD-10-CM | POA: Diagnosis not present

## 2019-07-30 DIAGNOSIS — E039 Hypothyroidism, unspecified: Secondary | ICD-10-CM | POA: Diagnosis not present

## 2019-07-31 DIAGNOSIS — E781 Pure hyperglyceridemia: Secondary | ICD-10-CM | POA: Diagnosis not present

## 2019-07-31 DIAGNOSIS — I5023 Acute on chronic systolic (congestive) heart failure: Secondary | ICD-10-CM | POA: Diagnosis not present

## 2019-07-31 DIAGNOSIS — S32810D Multiple fractures of pelvis with stable disruption of pelvic ring, subsequent encounter for fracture with routine healing: Secondary | ICD-10-CM | POA: Diagnosis not present

## 2019-07-31 DIAGNOSIS — F339 Major depressive disorder, recurrent, unspecified: Secondary | ICD-10-CM | POA: Diagnosis not present

## 2019-07-31 DIAGNOSIS — E039 Hypothyroidism, unspecified: Secondary | ICD-10-CM | POA: Diagnosis not present

## 2019-07-31 DIAGNOSIS — N183 Chronic kidney disease, stage 3 (moderate): Secondary | ICD-10-CM | POA: Diagnosis not present

## 2019-07-31 DIAGNOSIS — K219 Gastro-esophageal reflux disease without esophagitis: Secondary | ICD-10-CM | POA: Diagnosis not present

## 2019-07-31 DIAGNOSIS — I2510 Atherosclerotic heart disease of native coronary artery without angina pectoris: Secondary | ICD-10-CM | POA: Diagnosis not present

## 2019-07-31 DIAGNOSIS — I130 Hypertensive heart and chronic kidney disease with heart failure and stage 1 through stage 4 chronic kidney disease, or unspecified chronic kidney disease: Secondary | ICD-10-CM | POA: Diagnosis not present

## 2019-08-07 DIAGNOSIS — M25552 Pain in left hip: Secondary | ICD-10-CM | POA: Diagnosis not present

## 2019-08-07 DIAGNOSIS — S3282XD Multiple fractures of pelvis without disruption of pelvic ring, subsequent encounter for fracture with routine healing: Secondary | ICD-10-CM | POA: Diagnosis not present

## 2019-08-10 DIAGNOSIS — I2510 Atherosclerotic heart disease of native coronary artery without angina pectoris: Secondary | ICD-10-CM | POA: Diagnosis not present

## 2019-08-10 DIAGNOSIS — F339 Major depressive disorder, recurrent, unspecified: Secondary | ICD-10-CM | POA: Diagnosis not present

## 2019-08-10 DIAGNOSIS — E039 Hypothyroidism, unspecified: Secondary | ICD-10-CM | POA: Diagnosis not present

## 2019-08-10 DIAGNOSIS — N183 Chronic kidney disease, stage 3 (moderate): Secondary | ICD-10-CM | POA: Diagnosis not present

## 2019-08-10 DIAGNOSIS — E781 Pure hyperglyceridemia: Secondary | ICD-10-CM | POA: Diagnosis not present

## 2019-08-10 DIAGNOSIS — I130 Hypertensive heart and chronic kidney disease with heart failure and stage 1 through stage 4 chronic kidney disease, or unspecified chronic kidney disease: Secondary | ICD-10-CM | POA: Diagnosis not present

## 2019-08-10 DIAGNOSIS — K219 Gastro-esophageal reflux disease without esophagitis: Secondary | ICD-10-CM | POA: Diagnosis not present

## 2019-08-10 DIAGNOSIS — I5023 Acute on chronic systolic (congestive) heart failure: Secondary | ICD-10-CM | POA: Diagnosis not present

## 2019-08-10 DIAGNOSIS — S32810D Multiple fractures of pelvis with stable disruption of pelvic ring, subsequent encounter for fracture with routine healing: Secondary | ICD-10-CM | POA: Diagnosis not present

## 2019-09-15 DIAGNOSIS — I5020 Unspecified systolic (congestive) heart failure: Secondary | ICD-10-CM | POA: Diagnosis not present

## 2019-09-15 DIAGNOSIS — Z4502 Encounter for adjustment and management of automatic implantable cardiac defibrillator: Secondary | ICD-10-CM | POA: Diagnosis not present

## 2019-09-15 DIAGNOSIS — I429 Cardiomyopathy, unspecified: Secondary | ICD-10-CM | POA: Diagnosis not present

## 2019-09-15 DIAGNOSIS — Z95810 Presence of automatic (implantable) cardiac defibrillator: Secondary | ICD-10-CM | POA: Diagnosis not present

## 2019-09-21 DIAGNOSIS — Z6830 Body mass index (BMI) 30.0-30.9, adult: Secondary | ICD-10-CM | POA: Diagnosis not present

## 2019-09-21 DIAGNOSIS — Z1331 Encounter for screening for depression: Secondary | ICD-10-CM | POA: Diagnosis not present

## 2019-09-21 DIAGNOSIS — M25552 Pain in left hip: Secondary | ICD-10-CM | POA: Diagnosis not present

## 2019-09-21 DIAGNOSIS — I10 Essential (primary) hypertension: Secondary | ICD-10-CM | POA: Diagnosis not present

## 2019-09-21 DIAGNOSIS — I82409 Acute embolism and thrombosis of unspecified deep veins of unspecified lower extremity: Secondary | ICD-10-CM | POA: Diagnosis not present

## 2019-10-01 DIAGNOSIS — I82409 Acute embolism and thrombosis of unspecified deep veins of unspecified lower extremity: Secondary | ICD-10-CM | POA: Diagnosis not present

## 2019-10-01 DIAGNOSIS — E2740 Unspecified adrenocortical insufficiency: Secondary | ICD-10-CM | POA: Diagnosis not present

## 2019-10-01 DIAGNOSIS — Z6831 Body mass index (BMI) 31.0-31.9, adult: Secondary | ICD-10-CM | POA: Diagnosis not present

## 2019-10-01 DIAGNOSIS — M545 Low back pain: Secondary | ICD-10-CM | POA: Diagnosis not present

## 2019-10-04 DIAGNOSIS — Y998 Other external cause status: Secondary | ICD-10-CM | POA: Diagnosis not present

## 2019-10-04 DIAGNOSIS — S0003XA Contusion of scalp, initial encounter: Secondary | ICD-10-CM | POA: Diagnosis not present

## 2019-10-04 DIAGNOSIS — S4991XA Unspecified injury of right shoulder and upper arm, initial encounter: Secondary | ICD-10-CM | POA: Diagnosis not present

## 2019-10-04 DIAGNOSIS — M25521 Pain in right elbow: Secondary | ICD-10-CM | POA: Diagnosis not present

## 2019-10-04 DIAGNOSIS — S0990XA Unspecified injury of head, initial encounter: Secondary | ICD-10-CM | POA: Diagnosis not present

## 2019-10-04 DIAGNOSIS — W19XXXA Unspecified fall, initial encounter: Secondary | ICD-10-CM | POA: Diagnosis not present

## 2019-10-04 DIAGNOSIS — W01198A Fall on same level from slipping, tripping and stumbling with subsequent striking against other object, initial encounter: Secondary | ICD-10-CM | POA: Diagnosis not present

## 2019-10-04 DIAGNOSIS — R519 Headache, unspecified: Secondary | ICD-10-CM | POA: Diagnosis not present

## 2019-10-04 DIAGNOSIS — S80212A Abrasion, left knee, initial encounter: Secondary | ICD-10-CM | POA: Diagnosis not present

## 2019-10-04 DIAGNOSIS — M25562 Pain in left knee: Secondary | ICD-10-CM | POA: Diagnosis not present

## 2019-10-04 DIAGNOSIS — S199XXA Unspecified injury of neck, initial encounter: Secondary | ICD-10-CM | POA: Diagnosis not present

## 2019-10-04 DIAGNOSIS — S52124A Nondisplaced fracture of head of right radius, initial encounter for closed fracture: Secondary | ICD-10-CM | POA: Diagnosis not present

## 2019-10-04 DIAGNOSIS — S8002XA Contusion of left knee, initial encounter: Secondary | ICD-10-CM | POA: Diagnosis not present

## 2019-10-04 DIAGNOSIS — M542 Cervicalgia: Secondary | ICD-10-CM | POA: Diagnosis not present

## 2019-10-05 DIAGNOSIS — I252 Old myocardial infarction: Secondary | ICD-10-CM | POA: Diagnosis not present

## 2019-10-06 DIAGNOSIS — S52134A Nondisplaced fracture of neck of right radius, initial encounter for closed fracture: Secondary | ICD-10-CM | POA: Diagnosis not present

## 2019-10-13 DIAGNOSIS — S52134D Nondisplaced fracture of neck of right radius, subsequent encounter for closed fracture with routine healing: Secondary | ICD-10-CM | POA: Diagnosis not present

## 2019-10-15 DIAGNOSIS — S81002A Unspecified open wound, left knee, initial encounter: Secondary | ICD-10-CM | POA: Diagnosis not present

## 2019-10-15 DIAGNOSIS — I82409 Acute embolism and thrombosis of unspecified deep veins of unspecified lower extremity: Secondary | ICD-10-CM | POA: Diagnosis not present

## 2019-10-15 DIAGNOSIS — Z6830 Body mass index (BMI) 30.0-30.9, adult: Secondary | ICD-10-CM | POA: Diagnosis not present

## 2019-10-29 DIAGNOSIS — D649 Anemia, unspecified: Secondary | ICD-10-CM | POA: Diagnosis not present

## 2019-10-30 DIAGNOSIS — E039 Hypothyroidism, unspecified: Secondary | ICD-10-CM | POA: Diagnosis not present

## 2019-10-30 DIAGNOSIS — E2740 Unspecified adrenocortical insufficiency: Secondary | ICD-10-CM | POA: Diagnosis not present

## 2019-10-30 DIAGNOSIS — G629 Polyneuropathy, unspecified: Secondary | ICD-10-CM | POA: Diagnosis not present

## 2019-10-30 DIAGNOSIS — Z79899 Other long term (current) drug therapy: Secondary | ICD-10-CM | POA: Diagnosis not present

## 2019-10-30 DIAGNOSIS — Z9181 History of falling: Secondary | ICD-10-CM | POA: Diagnosis not present

## 2019-10-30 DIAGNOSIS — E2749 Other adrenocortical insufficiency: Secondary | ICD-10-CM | POA: Diagnosis not present

## 2019-11-03 DIAGNOSIS — S52134D Nondisplaced fracture of neck of right radius, subsequent encounter for closed fracture with routine healing: Secondary | ICD-10-CM | POA: Diagnosis not present

## 2019-11-16 DIAGNOSIS — Z780 Asymptomatic menopausal state: Secondary | ICD-10-CM | POA: Diagnosis not present

## 2019-11-16 DIAGNOSIS — M810 Age-related osteoporosis without current pathological fracture: Secondary | ICD-10-CM | POA: Diagnosis not present

## 2019-12-15 DIAGNOSIS — I472 Ventricular tachycardia: Secondary | ICD-10-CM | POA: Diagnosis not present

## 2019-12-15 DIAGNOSIS — I429 Cardiomyopathy, unspecified: Secondary | ICD-10-CM | POA: Diagnosis not present

## 2019-12-15 DIAGNOSIS — Z95810 Presence of automatic (implantable) cardiac defibrillator: Secondary | ICD-10-CM | POA: Diagnosis not present

## 2019-12-15 DIAGNOSIS — Z4502 Encounter for adjustment and management of automatic implantable cardiac defibrillator: Secondary | ICD-10-CM | POA: Diagnosis not present

## 2019-12-25 DIAGNOSIS — Z95810 Presence of automatic (implantable) cardiac defibrillator: Secondary | ICD-10-CM | POA: Diagnosis not present

## 2019-12-25 DIAGNOSIS — Z955 Presence of coronary angioplasty implant and graft: Secondary | ICD-10-CM | POA: Diagnosis not present

## 2019-12-25 DIAGNOSIS — N1831 Chronic kidney disease, stage 3a: Secondary | ICD-10-CM | POA: Diagnosis not present

## 2019-12-25 DIAGNOSIS — I5020 Unspecified systolic (congestive) heart failure: Secondary | ICD-10-CM | POA: Diagnosis not present

## 2019-12-25 DIAGNOSIS — Z9889 Other specified postprocedural states: Secondary | ICD-10-CM | POA: Diagnosis not present

## 2019-12-25 DIAGNOSIS — E871 Hypo-osmolality and hyponatremia: Secondary | ICD-10-CM | POA: Diagnosis not present

## 2019-12-25 DIAGNOSIS — I2510 Atherosclerotic heart disease of native coronary artery without angina pectoris: Secondary | ICD-10-CM | POA: Diagnosis not present

## 2019-12-25 DIAGNOSIS — E2740 Unspecified adrenocortical insufficiency: Secondary | ICD-10-CM | POA: Diagnosis not present

## 2019-12-25 DIAGNOSIS — I5023 Acute on chronic systolic (congestive) heart failure: Secondary | ICD-10-CM | POA: Diagnosis not present

## 2019-12-25 DIAGNOSIS — I255 Ischemic cardiomyopathy: Secondary | ICD-10-CM | POA: Diagnosis not present

## 2020-01-08 DIAGNOSIS — I083 Combined rheumatic disorders of mitral, aortic and tricuspid valves: Secondary | ICD-10-CM | POA: Diagnosis not present

## 2020-01-08 DIAGNOSIS — Z95810 Presence of automatic (implantable) cardiac defibrillator: Secondary | ICD-10-CM | POA: Diagnosis not present

## 2020-01-08 DIAGNOSIS — I5020 Unspecified systolic (congestive) heart failure: Secondary | ICD-10-CM | POA: Diagnosis not present

## 2020-01-08 DIAGNOSIS — I5023 Acute on chronic systolic (congestive) heart failure: Secondary | ICD-10-CM | POA: Diagnosis not present

## 2020-01-08 DIAGNOSIS — E2740 Unspecified adrenocortical insufficiency: Secondary | ICD-10-CM | POA: Diagnosis not present

## 2020-01-14 DIAGNOSIS — Z79899 Other long term (current) drug therapy: Secondary | ICD-10-CM | POA: Diagnosis not present

## 2020-01-14 DIAGNOSIS — I10 Essential (primary) hypertension: Secondary | ICD-10-CM | POA: Diagnosis not present

## 2020-01-14 DIAGNOSIS — Z6831 Body mass index (BMI) 31.0-31.9, adult: Secondary | ICD-10-CM | POA: Diagnosis not present

## 2020-01-14 DIAGNOSIS — K219 Gastro-esophageal reflux disease without esophagitis: Secondary | ICD-10-CM | POA: Diagnosis not present

## 2020-01-14 DIAGNOSIS — Z1331 Encounter for screening for depression: Secondary | ICD-10-CM | POA: Diagnosis not present

## 2020-01-14 DIAGNOSIS — E7800 Pure hypercholesterolemia, unspecified: Secondary | ICD-10-CM | POA: Diagnosis not present

## 2020-01-14 DIAGNOSIS — E039 Hypothyroidism, unspecified: Secondary | ICD-10-CM | POA: Diagnosis not present

## 2020-01-14 DIAGNOSIS — Z0000 Encounter for general adult medical examination without abnormal findings: Secondary | ICD-10-CM | POA: Diagnosis not present

## 2020-01-21 DIAGNOSIS — Z1211 Encounter for screening for malignant neoplasm of colon: Secondary | ICD-10-CM | POA: Diagnosis not present

## 2020-02-10 DIAGNOSIS — K529 Noninfective gastroenteritis and colitis, unspecified: Secondary | ICD-10-CM | POA: Diagnosis not present

## 2020-02-15 DIAGNOSIS — R195 Other fecal abnormalities: Secondary | ICD-10-CM | POA: Diagnosis not present

## 2020-02-15 DIAGNOSIS — R112 Nausea with vomiting, unspecified: Secondary | ICD-10-CM | POA: Diagnosis not present

## 2020-02-15 DIAGNOSIS — N183 Chronic kidney disease, stage 3 unspecified: Secondary | ICD-10-CM | POA: Diagnosis not present

## 2020-02-15 DIAGNOSIS — Z6829 Body mass index (BMI) 29.0-29.9, adult: Secondary | ICD-10-CM | POA: Diagnosis not present

## 2020-02-15 DIAGNOSIS — K529 Noninfective gastroenteritis and colitis, unspecified: Secondary | ICD-10-CM | POA: Diagnosis not present

## 2020-02-15 DIAGNOSIS — Z79899 Other long term (current) drug therapy: Secondary | ICD-10-CM | POA: Diagnosis not present

## 2020-02-15 DIAGNOSIS — K921 Melena: Secondary | ICD-10-CM | POA: Diagnosis not present

## 2020-02-16 DIAGNOSIS — K529 Noninfective gastroenteritis and colitis, unspecified: Secondary | ICD-10-CM | POA: Diagnosis not present

## 2020-02-16 DIAGNOSIS — K921 Melena: Secondary | ICD-10-CM | POA: Diagnosis not present

## 2020-02-16 DIAGNOSIS — I700 Atherosclerosis of aorta: Secondary | ICD-10-CM | POA: Diagnosis not present

## 2020-02-16 DIAGNOSIS — Z950 Presence of cardiac pacemaker: Secondary | ICD-10-CM | POA: Diagnosis not present

## 2020-02-16 DIAGNOSIS — R1013 Epigastric pain: Secondary | ICD-10-CM | POA: Diagnosis not present

## 2020-02-16 DIAGNOSIS — R079 Chest pain, unspecified: Secondary | ICD-10-CM | POA: Diagnosis not present

## 2020-02-16 DIAGNOSIS — K2970 Gastritis, unspecified, without bleeding: Secondary | ICD-10-CM | POA: Diagnosis not present

## 2020-02-16 DIAGNOSIS — Z79899 Other long term (current) drug therapy: Secondary | ICD-10-CM | POA: Diagnosis not present

## 2020-02-16 DIAGNOSIS — Z7902 Long term (current) use of antithrombotics/antiplatelets: Secondary | ICD-10-CM | POA: Diagnosis not present

## 2020-02-16 DIAGNOSIS — K2971 Gastritis, unspecified, with bleeding: Secondary | ICD-10-CM | POA: Diagnosis not present

## 2020-02-16 DIAGNOSIS — K922 Gastrointestinal hemorrhage, unspecified: Secondary | ICD-10-CM | POA: Diagnosis not present

## 2020-02-16 DIAGNOSIS — I10 Essential (primary) hypertension: Secondary | ICD-10-CM | POA: Diagnosis not present

## 2020-02-22 DIAGNOSIS — K2970 Gastritis, unspecified, without bleeding: Secondary | ICD-10-CM | POA: Diagnosis not present

## 2020-02-22 DIAGNOSIS — Z1331 Encounter for screening for depression: Secondary | ICD-10-CM | POA: Diagnosis not present

## 2020-02-22 DIAGNOSIS — K921 Melena: Secondary | ICD-10-CM | POA: Diagnosis not present

## 2020-02-22 DIAGNOSIS — Z6829 Body mass index (BMI) 29.0-29.9, adult: Secondary | ICD-10-CM | POA: Diagnosis not present

## 2020-02-26 DIAGNOSIS — I252 Old myocardial infarction: Secondary | ICD-10-CM | POA: Diagnosis not present

## 2020-02-26 DIAGNOSIS — Z95810 Presence of automatic (implantable) cardiac defibrillator: Secondary | ICD-10-CM | POA: Diagnosis not present

## 2020-02-26 DIAGNOSIS — K219 Gastro-esophageal reflux disease without esophagitis: Secondary | ICD-10-CM | POA: Diagnosis not present

## 2020-02-26 DIAGNOSIS — Z955 Presence of coronary angioplasty implant and graft: Secondary | ICD-10-CM | POA: Diagnosis not present

## 2020-02-26 DIAGNOSIS — N183 Chronic kidney disease, stage 3 unspecified: Secondary | ICD-10-CM | POA: Diagnosis not present

## 2020-02-26 DIAGNOSIS — I2510 Atherosclerotic heart disease of native coronary artery without angina pectoris: Secondary | ICD-10-CM | POA: Diagnosis not present

## 2020-02-26 DIAGNOSIS — E2740 Unspecified adrenocortical insufficiency: Secondary | ICD-10-CM | POA: Diagnosis not present

## 2020-02-26 DIAGNOSIS — E039 Hypothyroidism, unspecified: Secondary | ICD-10-CM | POA: Diagnosis not present

## 2020-02-26 DIAGNOSIS — I5020 Unspecified systolic (congestive) heart failure: Secondary | ICD-10-CM | POA: Diagnosis not present

## 2020-03-15 DIAGNOSIS — I255 Ischemic cardiomyopathy: Secondary | ICD-10-CM | POA: Diagnosis not present

## 2020-03-15 DIAGNOSIS — I5020 Unspecified systolic (congestive) heart failure: Secondary | ICD-10-CM | POA: Diagnosis not present

## 2020-03-15 DIAGNOSIS — Z4502 Encounter for adjustment and management of automatic implantable cardiac defibrillator: Secondary | ICD-10-CM | POA: Diagnosis not present

## 2020-03-15 DIAGNOSIS — I429 Cardiomyopathy, unspecified: Secondary | ICD-10-CM | POA: Diagnosis not present

## 2020-03-15 DIAGNOSIS — I472 Ventricular tachycardia: Secondary | ICD-10-CM | POA: Diagnosis not present

## 2020-03-15 DIAGNOSIS — Z95810 Presence of automatic (implantable) cardiac defibrillator: Secondary | ICD-10-CM | POA: Diagnosis not present

## 2020-03-16 DIAGNOSIS — R1013 Epigastric pain: Secondary | ICD-10-CM | POA: Diagnosis not present

## 2020-03-16 DIAGNOSIS — R195 Other fecal abnormalities: Secondary | ICD-10-CM | POA: Diagnosis not present

## 2020-03-16 DIAGNOSIS — Z20828 Contact with and (suspected) exposure to other viral communicable diseases: Secondary | ICD-10-CM | POA: Diagnosis not present

## 2020-03-16 DIAGNOSIS — Z7189 Other specified counseling: Secondary | ICD-10-CM | POA: Diagnosis not present

## 2020-03-22 DIAGNOSIS — K449 Diaphragmatic hernia without obstruction or gangrene: Secondary | ICD-10-CM | POA: Diagnosis not present

## 2020-03-22 DIAGNOSIS — R634 Abnormal weight loss: Secondary | ICD-10-CM | POA: Diagnosis not present

## 2020-03-22 DIAGNOSIS — N189 Chronic kidney disease, unspecified: Secondary | ICD-10-CM | POA: Diagnosis not present

## 2020-03-22 DIAGNOSIS — I129 Hypertensive chronic kidney disease with stage 1 through stage 4 chronic kidney disease, or unspecified chronic kidney disease: Secondary | ICD-10-CM | POA: Diagnosis not present

## 2020-03-22 DIAGNOSIS — Z95810 Presence of automatic (implantable) cardiac defibrillator: Secondary | ICD-10-CM | POA: Diagnosis not present

## 2020-03-22 DIAGNOSIS — Z79899 Other long term (current) drug therapy: Secondary | ICD-10-CM | POA: Diagnosis not present

## 2020-03-22 DIAGNOSIS — K222 Esophageal obstruction: Secondary | ICD-10-CM | POA: Diagnosis not present

## 2020-03-22 DIAGNOSIS — Z7982 Long term (current) use of aspirin: Secondary | ICD-10-CM | POA: Diagnosis not present

## 2020-03-22 DIAGNOSIS — I252 Old myocardial infarction: Secondary | ICD-10-CM | POA: Diagnosis not present

## 2020-03-22 DIAGNOSIS — Z955 Presence of coronary angioplasty implant and graft: Secondary | ICD-10-CM | POA: Diagnosis not present

## 2020-03-22 DIAGNOSIS — R1013 Epigastric pain: Secondary | ICD-10-CM | POA: Diagnosis not present

## 2020-03-22 DIAGNOSIS — R17 Unspecified jaundice: Secondary | ICD-10-CM | POA: Diagnosis not present

## 2020-03-22 DIAGNOSIS — K921 Melena: Secondary | ICD-10-CM | POA: Diagnosis not present

## 2020-05-27 DIAGNOSIS — E2740 Unspecified adrenocortical insufficiency: Secondary | ICD-10-CM | POA: Diagnosis not present

## 2020-05-27 DIAGNOSIS — I10 Essential (primary) hypertension: Secondary | ICD-10-CM | POA: Diagnosis not present

## 2020-05-27 DIAGNOSIS — E039 Hypothyroidism, unspecified: Secondary | ICD-10-CM | POA: Diagnosis not present

## 2020-06-01 DIAGNOSIS — Z01818 Encounter for other preprocedural examination: Secondary | ICD-10-CM | POA: Diagnosis not present

## 2020-06-01 DIAGNOSIS — Z1211 Encounter for screening for malignant neoplasm of colon: Secondary | ICD-10-CM | POA: Diagnosis not present

## 2020-06-06 DIAGNOSIS — Z6829 Body mass index (BMI) 29.0-29.9, adult: Secondary | ICD-10-CM | POA: Diagnosis not present

## 2020-06-06 DIAGNOSIS — T1490XA Injury, unspecified, initial encounter: Secondary | ICD-10-CM | POA: Diagnosis not present

## 2020-06-06 DIAGNOSIS — I10 Essential (primary) hypertension: Secondary | ICD-10-CM | POA: Diagnosis not present

## 2020-06-14 DIAGNOSIS — Z7189 Other specified counseling: Secondary | ICD-10-CM | POA: Diagnosis not present

## 2020-06-14 DIAGNOSIS — Z4502 Encounter for adjustment and management of automatic implantable cardiac defibrillator: Secondary | ICD-10-CM | POA: Diagnosis not present

## 2020-06-14 DIAGNOSIS — Z20828 Contact with and (suspected) exposure to other viral communicable diseases: Secondary | ICD-10-CM | POA: Diagnosis not present

## 2020-06-14 DIAGNOSIS — I5020 Unspecified systolic (congestive) heart failure: Secondary | ICD-10-CM | POA: Diagnosis not present

## 2020-06-14 DIAGNOSIS — I472 Ventricular tachycardia: Secondary | ICD-10-CM | POA: Diagnosis not present

## 2020-06-14 DIAGNOSIS — I428 Other cardiomyopathies: Secondary | ICD-10-CM | POA: Diagnosis not present

## 2020-06-21 DIAGNOSIS — K635 Polyp of colon: Secondary | ICD-10-CM | POA: Diagnosis not present

## 2020-06-21 DIAGNOSIS — I252 Old myocardial infarction: Secondary | ICD-10-CM | POA: Diagnosis not present

## 2020-06-21 DIAGNOSIS — I2510 Atherosclerotic heart disease of native coronary artery without angina pectoris: Secondary | ICD-10-CM | POA: Diagnosis not present

## 2020-06-21 DIAGNOSIS — K621 Rectal polyp: Secondary | ICD-10-CM | POA: Diagnosis not present

## 2020-06-21 DIAGNOSIS — D122 Benign neoplasm of ascending colon: Secondary | ICD-10-CM | POA: Diagnosis not present

## 2020-06-21 DIAGNOSIS — R195 Other fecal abnormalities: Secondary | ICD-10-CM | POA: Diagnosis not present

## 2020-06-21 DIAGNOSIS — I10 Essential (primary) hypertension: Secondary | ICD-10-CM | POA: Diagnosis not present

## 2020-06-21 DIAGNOSIS — Z95810 Presence of automatic (implantable) cardiac defibrillator: Secondary | ICD-10-CM | POA: Diagnosis not present

## 2020-06-21 DIAGNOSIS — Z1211 Encounter for screening for malignant neoplasm of colon: Secondary | ICD-10-CM | POA: Diagnosis not present

## 2020-06-21 DIAGNOSIS — D128 Benign neoplasm of rectum: Secondary | ICD-10-CM | POA: Diagnosis not present

## 2020-06-21 DIAGNOSIS — D126 Benign neoplasm of colon, unspecified: Secondary | ICD-10-CM | POA: Diagnosis not present

## 2020-06-29 DIAGNOSIS — Z6827 Body mass index (BMI) 27.0-27.9, adult: Secondary | ICD-10-CM | POA: Diagnosis not present

## 2020-06-29 DIAGNOSIS — R110 Nausea: Secondary | ICD-10-CM | POA: Diagnosis not present

## 2020-06-29 DIAGNOSIS — I739 Peripheral vascular disease, unspecified: Secondary | ICD-10-CM | POA: Diagnosis not present

## 2020-06-29 DIAGNOSIS — E039 Hypothyroidism, unspecified: Secondary | ICD-10-CM | POA: Diagnosis not present

## 2020-07-15 DIAGNOSIS — E2740 Unspecified adrenocortical insufficiency: Secondary | ICD-10-CM | POA: Diagnosis not present

## 2020-07-15 DIAGNOSIS — R5383 Other fatigue: Secondary | ICD-10-CM | POA: Diagnosis not present

## 2020-07-15 DIAGNOSIS — E039 Hypothyroidism, unspecified: Secondary | ICD-10-CM | POA: Diagnosis not present

## 2020-07-15 DIAGNOSIS — G629 Polyneuropathy, unspecified: Secondary | ICD-10-CM | POA: Diagnosis not present

## 2020-07-19 DIAGNOSIS — E2740 Unspecified adrenocortical insufficiency: Secondary | ICD-10-CM | POA: Diagnosis not present

## 2020-07-19 DIAGNOSIS — E039 Hypothyroidism, unspecified: Secondary | ICD-10-CM | POA: Diagnosis not present

## 2020-07-19 DIAGNOSIS — R5383 Other fatigue: Secondary | ICD-10-CM | POA: Diagnosis not present

## 2020-07-19 DIAGNOSIS — G629 Polyneuropathy, unspecified: Secondary | ICD-10-CM | POA: Diagnosis not present

## 2020-07-28 DIAGNOSIS — R1013 Epigastric pain: Secondary | ICD-10-CM | POA: Diagnosis not present

## 2020-07-28 DIAGNOSIS — K5900 Constipation, unspecified: Secondary | ICD-10-CM | POA: Diagnosis not present

## 2020-08-11 DIAGNOSIS — H52223 Regular astigmatism, bilateral: Secondary | ICD-10-CM | POA: Diagnosis not present

## 2020-08-11 DIAGNOSIS — H5203 Hypermetropia, bilateral: Secondary | ICD-10-CM | POA: Diagnosis not present

## 2020-08-11 DIAGNOSIS — Z961 Presence of intraocular lens: Secondary | ICD-10-CM | POA: Diagnosis not present

## 2020-08-11 DIAGNOSIS — H02831 Dermatochalasis of right upper eyelid: Secondary | ICD-10-CM | POA: Diagnosis not present

## 2020-09-13 DIAGNOSIS — Z4502 Encounter for adjustment and management of automatic implantable cardiac defibrillator: Secondary | ICD-10-CM | POA: Diagnosis not present

## 2020-09-13 DIAGNOSIS — I5020 Unspecified systolic (congestive) heart failure: Secondary | ICD-10-CM | POA: Diagnosis not present

## 2020-09-13 DIAGNOSIS — I110 Hypertensive heart disease with heart failure: Secondary | ICD-10-CM | POA: Diagnosis not present

## 2020-09-13 DIAGNOSIS — I429 Cardiomyopathy, unspecified: Secondary | ICD-10-CM | POA: Diagnosis not present

## 2020-09-28 DIAGNOSIS — R1310 Dysphagia, unspecified: Secondary | ICD-10-CM | POA: Diagnosis not present

## 2020-09-28 DIAGNOSIS — K222 Esophageal obstruction: Secondary | ICD-10-CM | POA: Diagnosis not present

## 2020-09-28 DIAGNOSIS — R1013 Epigastric pain: Secondary | ICD-10-CM | POA: Diagnosis not present

## 2020-09-30 DIAGNOSIS — R1310 Dysphagia, unspecified: Secondary | ICD-10-CM | POA: Diagnosis not present

## 2020-09-30 DIAGNOSIS — K219 Gastro-esophageal reflux disease without esophagitis: Secondary | ICD-10-CM | POA: Diagnosis not present

## 2020-10-04 DIAGNOSIS — Z6828 Body mass index (BMI) 28.0-28.9, adult: Secondary | ICD-10-CM | POA: Diagnosis not present

## 2020-10-04 DIAGNOSIS — M533 Sacrococcygeal disorders, not elsewhere classified: Secondary | ICD-10-CM | POA: Diagnosis not present

## 2020-10-04 DIAGNOSIS — I739 Peripheral vascular disease, unspecified: Secondary | ICD-10-CM | POA: Diagnosis not present

## 2020-10-13 DIAGNOSIS — N183 Chronic kidney disease, stage 3 unspecified: Secondary | ICD-10-CM | POA: Diagnosis not present

## 2020-10-13 DIAGNOSIS — E2740 Unspecified adrenocortical insufficiency: Secondary | ICD-10-CM | POA: Diagnosis not present

## 2020-10-13 DIAGNOSIS — Z8781 Personal history of (healed) traumatic fracture: Secondary | ICD-10-CM | POA: Diagnosis not present

## 2020-10-13 DIAGNOSIS — Z95810 Presence of automatic (implantable) cardiac defibrillator: Secondary | ICD-10-CM | POA: Diagnosis not present

## 2020-10-13 DIAGNOSIS — I129 Hypertensive chronic kidney disease with stage 1 through stage 4 chronic kidney disease, or unspecified chronic kidney disease: Secondary | ICD-10-CM | POA: Diagnosis not present

## 2020-10-13 DIAGNOSIS — Z9181 History of falling: Secondary | ICD-10-CM | POA: Diagnosis not present

## 2020-10-13 DIAGNOSIS — I25118 Atherosclerotic heart disease of native coronary artery with other forms of angina pectoris: Secondary | ICD-10-CM | POA: Diagnosis not present

## 2020-10-13 DIAGNOSIS — M545 Low back pain, unspecified: Secondary | ICD-10-CM | POA: Diagnosis not present

## 2020-10-18 DIAGNOSIS — E039 Hypothyroidism, unspecified: Secondary | ICD-10-CM | POA: Diagnosis not present

## 2020-10-18 DIAGNOSIS — E2749 Other adrenocortical insufficiency: Secondary | ICD-10-CM | POA: Diagnosis not present

## 2020-10-18 DIAGNOSIS — E2740 Unspecified adrenocortical insufficiency: Secondary | ICD-10-CM | POA: Diagnosis not present

## 2020-10-28 DIAGNOSIS — Z95810 Presence of automatic (implantable) cardiac defibrillator: Secondary | ICD-10-CM | POA: Diagnosis not present

## 2020-10-28 DIAGNOSIS — R0602 Shortness of breath: Secondary | ICD-10-CM | POA: Diagnosis not present

## 2020-10-28 DIAGNOSIS — I5020 Unspecified systolic (congestive) heart failure: Secondary | ICD-10-CM | POA: Diagnosis not present

## 2020-10-28 DIAGNOSIS — Z9861 Coronary angioplasty status: Secondary | ICD-10-CM | POA: Diagnosis not present

## 2020-10-29 DIAGNOSIS — J324 Chronic pansinusitis: Secondary | ICD-10-CM | POA: Diagnosis not present

## 2020-10-31 DIAGNOSIS — R0602 Shortness of breath: Secondary | ICD-10-CM | POA: Diagnosis not present

## 2020-10-31 DIAGNOSIS — I5020 Unspecified systolic (congestive) heart failure: Secondary | ICD-10-CM | POA: Diagnosis not present

## 2020-10-31 DIAGNOSIS — Z95810 Presence of automatic (implantable) cardiac defibrillator: Secondary | ICD-10-CM | POA: Diagnosis not present

## 2020-10-31 DIAGNOSIS — Z9861 Coronary angioplasty status: Secondary | ICD-10-CM | POA: Diagnosis not present

## 2020-11-21 DIAGNOSIS — R1013 Epigastric pain: Secondary | ICD-10-CM | POA: Diagnosis not present

## 2020-11-21 DIAGNOSIS — K5900 Constipation, unspecified: Secondary | ICD-10-CM | POA: Diagnosis not present

## 2020-12-01 DIAGNOSIS — Z6829 Body mass index (BMI) 29.0-29.9, adult: Secondary | ICD-10-CM | POA: Diagnosis not present

## 2020-12-01 DIAGNOSIS — R21 Rash and other nonspecific skin eruption: Secondary | ICD-10-CM | POA: Diagnosis not present

## 2020-12-08 DIAGNOSIS — M25552 Pain in left hip: Secondary | ICD-10-CM | POA: Diagnosis not present

## 2020-12-08 DIAGNOSIS — Z6829 Body mass index (BMI) 29.0-29.9, adult: Secondary | ICD-10-CM | POA: Diagnosis not present

## 2020-12-13 DIAGNOSIS — Z4502 Encounter for adjustment and management of automatic implantable cardiac defibrillator: Secondary | ICD-10-CM | POA: Diagnosis not present

## 2020-12-13 DIAGNOSIS — I425 Other restrictive cardiomyopathy: Secondary | ICD-10-CM | POA: Diagnosis not present

## 2020-12-13 DIAGNOSIS — I472 Ventricular tachycardia: Secondary | ICD-10-CM | POA: Diagnosis not present

## 2020-12-16 DIAGNOSIS — S3282XD Multiple fractures of pelvis without disruption of pelvic ring, subsequent encounter for fracture with routine healing: Secondary | ICD-10-CM | POA: Diagnosis not present

## 2020-12-16 DIAGNOSIS — M1612 Unilateral primary osteoarthritis, left hip: Secondary | ICD-10-CM | POA: Diagnosis not present

## 2020-12-28 DIAGNOSIS — R1013 Epigastric pain: Secondary | ICD-10-CM | POA: Diagnosis not present

## 2020-12-28 DIAGNOSIS — R1310 Dysphagia, unspecified: Secondary | ICD-10-CM | POA: Diagnosis not present

## 2020-12-28 DIAGNOSIS — K219 Gastro-esophageal reflux disease without esophagitis: Secondary | ICD-10-CM | POA: Diagnosis not present

## 2021-01-04 DIAGNOSIS — M25552 Pain in left hip: Secondary | ICD-10-CM | POA: Diagnosis not present

## 2021-01-04 DIAGNOSIS — J3489 Other specified disorders of nose and nasal sinuses: Secondary | ICD-10-CM | POA: Diagnosis not present

## 2021-01-04 DIAGNOSIS — Z1159 Encounter for screening for other viral diseases: Secondary | ICD-10-CM | POA: Diagnosis not present

## 2021-01-04 DIAGNOSIS — Z20828 Contact with and (suspected) exposure to other viral communicable diseases: Secondary | ICD-10-CM | POA: Diagnosis not present

## 2021-01-10 DIAGNOSIS — K222 Esophageal obstruction: Secondary | ICD-10-CM | POA: Diagnosis not present

## 2021-01-10 DIAGNOSIS — R1310 Dysphagia, unspecified: Secondary | ICD-10-CM | POA: Diagnosis not present

## 2021-01-10 DIAGNOSIS — K219 Gastro-esophageal reflux disease without esophagitis: Secondary | ICD-10-CM | POA: Diagnosis not present

## 2021-01-10 DIAGNOSIS — K449 Diaphragmatic hernia without obstruction or gangrene: Secondary | ICD-10-CM | POA: Diagnosis not present

## 2021-01-24 DIAGNOSIS — E2749 Other adrenocortical insufficiency: Secondary | ICD-10-CM | POA: Diagnosis not present

## 2021-01-24 DIAGNOSIS — E039 Hypothyroidism, unspecified: Secondary | ICD-10-CM | POA: Diagnosis not present

## 2021-01-24 DIAGNOSIS — E2740 Unspecified adrenocortical insufficiency: Secondary | ICD-10-CM | POA: Diagnosis not present

## 2021-02-02 DIAGNOSIS — N3091 Cystitis, unspecified with hematuria: Secondary | ICD-10-CM | POA: Diagnosis not present

## 2021-02-02 DIAGNOSIS — Z20828 Contact with and (suspected) exposure to other viral communicable diseases: Secondary | ICD-10-CM | POA: Diagnosis not present

## 2021-02-02 DIAGNOSIS — N3000 Acute cystitis without hematuria: Secondary | ICD-10-CM | POA: Diagnosis not present

## 2021-02-02 DIAGNOSIS — M791 Myalgia, unspecified site: Secondary | ICD-10-CM | POA: Diagnosis not present

## 2021-02-08 DIAGNOSIS — E039 Hypothyroidism, unspecified: Secondary | ICD-10-CM | POA: Diagnosis not present

## 2021-02-08 DIAGNOSIS — I10 Essential (primary) hypertension: Secondary | ICD-10-CM | POA: Diagnosis not present

## 2021-02-08 DIAGNOSIS — M25552 Pain in left hip: Secondary | ICD-10-CM | POA: Diagnosis not present

## 2021-02-08 DIAGNOSIS — Z6828 Body mass index (BMI) 28.0-28.9, adult: Secondary | ICD-10-CM | POA: Diagnosis not present

## 2021-02-08 DIAGNOSIS — H02539 Eyelid retraction unspecified eye, unspecified lid: Secondary | ICD-10-CM | POA: Diagnosis not present

## 2021-02-08 DIAGNOSIS — E7800 Pure hypercholesterolemia, unspecified: Secondary | ICD-10-CM | POA: Diagnosis not present

## 2021-02-13 DIAGNOSIS — K222 Esophageal obstruction: Secondary | ICD-10-CM | POA: Diagnosis not present

## 2021-02-13 DIAGNOSIS — R1013 Epigastric pain: Secondary | ICD-10-CM | POA: Diagnosis not present

## 2021-03-11 DIAGNOSIS — I10 Essential (primary) hypertension: Secondary | ICD-10-CM | POA: Diagnosis not present

## 2021-03-11 DIAGNOSIS — E039 Hypothyroidism, unspecified: Secondary | ICD-10-CM | POA: Diagnosis not present

## 2021-03-11 DIAGNOSIS — E7800 Pure hypercholesterolemia, unspecified: Secondary | ICD-10-CM | POA: Diagnosis not present

## 2021-03-14 DIAGNOSIS — Z4502 Encounter for adjustment and management of automatic implantable cardiac defibrillator: Secondary | ICD-10-CM | POA: Diagnosis not present

## 2021-03-14 DIAGNOSIS — I429 Cardiomyopathy, unspecified: Secondary | ICD-10-CM | POA: Diagnosis not present

## 2021-05-08 DIAGNOSIS — N183 Chronic kidney disease, stage 3 unspecified: Secondary | ICD-10-CM | POA: Diagnosis not present

## 2021-05-08 DIAGNOSIS — E2740 Unspecified adrenocortical insufficiency: Secondary | ICD-10-CM | POA: Diagnosis not present

## 2021-05-08 DIAGNOSIS — E039 Hypothyroidism, unspecified: Secondary | ICD-10-CM | POA: Diagnosis not present

## 2021-05-08 DIAGNOSIS — I130 Hypertensive heart and chronic kidney disease with heart failure and stage 1 through stage 4 chronic kidney disease, or unspecified chronic kidney disease: Secondary | ICD-10-CM | POA: Diagnosis not present

## 2021-05-08 DIAGNOSIS — I25118 Atherosclerotic heart disease of native coronary artery with other forms of angina pectoris: Secondary | ICD-10-CM | POA: Diagnosis not present

## 2021-05-08 DIAGNOSIS — J449 Chronic obstructive pulmonary disease, unspecified: Secondary | ICD-10-CM | POA: Diagnosis not present

## 2021-05-08 DIAGNOSIS — K219 Gastro-esophageal reflux disease without esophagitis: Secondary | ICD-10-CM | POA: Diagnosis not present

## 2021-05-08 DIAGNOSIS — I739 Peripheral vascular disease, unspecified: Secondary | ICD-10-CM | POA: Diagnosis not present

## 2021-05-08 DIAGNOSIS — I509 Heart failure, unspecified: Secondary | ICD-10-CM | POA: Diagnosis not present

## 2021-05-09 DIAGNOSIS — H53453 Other localized visual field defect, bilateral: Secondary | ICD-10-CM | POA: Diagnosis not present

## 2021-05-10 DIAGNOSIS — Z95810 Presence of automatic (implantable) cardiac defibrillator: Secondary | ICD-10-CM | POA: Diagnosis not present

## 2021-05-10 DIAGNOSIS — E7849 Other hyperlipidemia: Secondary | ICD-10-CM | POA: Diagnosis not present

## 2021-05-10 DIAGNOSIS — Z9861 Coronary angioplasty status: Secondary | ICD-10-CM | POA: Diagnosis not present

## 2021-05-10 DIAGNOSIS — I5023 Acute on chronic systolic (congestive) heart failure: Secondary | ICD-10-CM | POA: Diagnosis not present

## 2021-05-16 DIAGNOSIS — H02834 Dermatochalasis of left upper eyelid: Secondary | ICD-10-CM | POA: Diagnosis not present

## 2021-05-16 DIAGNOSIS — H02403 Unspecified ptosis of bilateral eyelids: Secondary | ICD-10-CM | POA: Diagnosis not present

## 2021-05-16 DIAGNOSIS — H02831 Dermatochalasis of right upper eyelid: Secondary | ICD-10-CM | POA: Diagnosis not present

## 2021-06-06 DIAGNOSIS — Z4502 Encounter for adjustment and management of automatic implantable cardiac defibrillator: Secondary | ICD-10-CM | POA: Diagnosis not present

## 2021-06-06 DIAGNOSIS — I5020 Unspecified systolic (congestive) heart failure: Secondary | ICD-10-CM | POA: Diagnosis not present

## 2021-07-05 DIAGNOSIS — H02403 Unspecified ptosis of bilateral eyelids: Secondary | ICD-10-CM | POA: Diagnosis not present

## 2021-07-05 DIAGNOSIS — Z01812 Encounter for preprocedural laboratory examination: Secondary | ICD-10-CM | POA: Diagnosis not present

## 2021-07-05 DIAGNOSIS — H02831 Dermatochalasis of right upper eyelid: Secondary | ICD-10-CM | POA: Diagnosis not present

## 2021-07-05 DIAGNOSIS — H02834 Dermatochalasis of left upper eyelid: Secondary | ICD-10-CM | POA: Diagnosis not present

## 2021-07-10 DIAGNOSIS — H02403 Unspecified ptosis of bilateral eyelids: Secondary | ICD-10-CM | POA: Diagnosis not present

## 2021-07-10 DIAGNOSIS — H02834 Dermatochalasis of left upper eyelid: Secondary | ICD-10-CM | POA: Diagnosis not present

## 2021-07-10 DIAGNOSIS — H02831 Dermatochalasis of right upper eyelid: Secondary | ICD-10-CM | POA: Diagnosis not present

## 2021-07-24 DIAGNOSIS — E039 Hypothyroidism, unspecified: Secondary | ICD-10-CM | POA: Diagnosis not present

## 2021-07-24 DIAGNOSIS — Z79899 Other long term (current) drug therapy: Secondary | ICD-10-CM | POA: Diagnosis not present

## 2021-07-24 DIAGNOSIS — I10 Essential (primary) hypertension: Secondary | ICD-10-CM | POA: Diagnosis not present

## 2021-08-10 DIAGNOSIS — Z9189 Other specified personal risk factors, not elsewhere classified: Secondary | ICD-10-CM | POA: Diagnosis not present

## 2021-08-10 DIAGNOSIS — E2749 Other adrenocortical insufficiency: Secondary | ICD-10-CM | POA: Diagnosis not present

## 2021-08-10 DIAGNOSIS — E2740 Unspecified adrenocortical insufficiency: Secondary | ICD-10-CM | POA: Diagnosis not present

## 2021-08-10 DIAGNOSIS — E039 Hypothyroidism, unspecified: Secondary | ICD-10-CM | POA: Diagnosis not present

## 2021-08-11 DIAGNOSIS — E7800 Pure hypercholesterolemia, unspecified: Secondary | ICD-10-CM | POA: Diagnosis not present

## 2021-08-11 DIAGNOSIS — I10 Essential (primary) hypertension: Secondary | ICD-10-CM | POA: Diagnosis not present

## 2021-08-18 DIAGNOSIS — E2740 Unspecified adrenocortical insufficiency: Secondary | ICD-10-CM | POA: Diagnosis not present

## 2021-08-18 DIAGNOSIS — Z1331 Encounter for screening for depression: Secondary | ICD-10-CM | POA: Diagnosis not present

## 2021-08-18 DIAGNOSIS — I739 Peripheral vascular disease, unspecified: Secondary | ICD-10-CM | POA: Diagnosis not present

## 2021-08-18 DIAGNOSIS — R200 Anesthesia of skin: Secondary | ICD-10-CM | POA: Diagnosis not present

## 2021-08-18 DIAGNOSIS — Z23 Encounter for immunization: Secondary | ICD-10-CM | POA: Diagnosis not present

## 2021-08-18 DIAGNOSIS — Z79899 Other long term (current) drug therapy: Secondary | ICD-10-CM | POA: Diagnosis not present

## 2021-08-18 DIAGNOSIS — Z0000 Encounter for general adult medical examination without abnormal findings: Secondary | ICD-10-CM | POA: Diagnosis not present

## 2021-08-18 DIAGNOSIS — N183 Chronic kidney disease, stage 3 unspecified: Secondary | ICD-10-CM | POA: Diagnosis not present

## 2021-08-18 DIAGNOSIS — E7800 Pure hypercholesterolemia, unspecified: Secondary | ICD-10-CM | POA: Diagnosis not present

## 2021-09-05 DIAGNOSIS — Z4502 Encounter for adjustment and management of automatic implantable cardiac defibrillator: Secondary | ICD-10-CM | POA: Diagnosis not present

## 2021-09-05 DIAGNOSIS — I429 Cardiomyopathy, unspecified: Secondary | ICD-10-CM | POA: Diagnosis not present

## 2021-09-28 DIAGNOSIS — J0100 Acute maxillary sinusitis, unspecified: Secondary | ICD-10-CM | POA: Diagnosis not present

## 2021-09-28 DIAGNOSIS — J209 Acute bronchitis, unspecified: Secondary | ICD-10-CM | POA: Diagnosis not present

## 2021-10-03 ENCOUNTER — Ambulatory Visit: Payer: Medicare HMO | Admitting: Neurology

## 2021-12-05 DIAGNOSIS — Z95810 Presence of automatic (implantable) cardiac defibrillator: Secondary | ICD-10-CM | POA: Diagnosis not present

## 2021-12-05 DIAGNOSIS — I429 Cardiomyopathy, unspecified: Secondary | ICD-10-CM | POA: Diagnosis not present

## 2021-12-20 DIAGNOSIS — Z9861 Coronary angioplasty status: Secondary | ICD-10-CM | POA: Diagnosis not present

## 2021-12-20 DIAGNOSIS — E7849 Other hyperlipidemia: Secondary | ICD-10-CM | POA: Diagnosis not present

## 2021-12-20 DIAGNOSIS — M25552 Pain in left hip: Secondary | ICD-10-CM | POA: Diagnosis not present

## 2021-12-20 DIAGNOSIS — Z95810 Presence of automatic (implantable) cardiac defibrillator: Secondary | ICD-10-CM | POA: Diagnosis not present

## 2021-12-20 DIAGNOSIS — I5020 Unspecified systolic (congestive) heart failure: Secondary | ICD-10-CM | POA: Diagnosis not present

## 2022-01-08 ENCOUNTER — Other Ambulatory Visit: Payer: Self-pay | Admitting: Sports Medicine

## 2022-01-08 DIAGNOSIS — M25552 Pain in left hip: Secondary | ICD-10-CM | POA: Diagnosis not present

## 2022-01-08 DIAGNOSIS — M1612 Unilateral primary osteoarthritis, left hip: Secondary | ICD-10-CM

## 2022-01-08 DIAGNOSIS — S3282XD Multiple fractures of pelvis without disruption of pelvic ring, subsequent encounter for fracture with routine healing: Secondary | ICD-10-CM | POA: Diagnosis not present

## 2022-01-19 DIAGNOSIS — I5020 Unspecified systolic (congestive) heart failure: Secondary | ICD-10-CM | POA: Diagnosis not present

## 2022-01-22 DIAGNOSIS — M549 Dorsalgia, unspecified: Secondary | ICD-10-CM | POA: Diagnosis not present

## 2022-01-30 ENCOUNTER — Other Ambulatory Visit: Payer: Self-pay

## 2022-01-30 ENCOUNTER — Ambulatory Visit
Admission: RE | Admit: 2022-01-30 | Discharge: 2022-01-30 | Disposition: A | Discharge status: Home / Self Care | Payer: Self-pay | Source: Ambulatory Visit | Attending: Sports Medicine | Admitting: Sports Medicine

## 2022-01-30 DIAGNOSIS — M1612 Unilateral primary osteoarthritis, left hip: Secondary | ICD-10-CM

## 2022-01-30 DIAGNOSIS — M25552 Pain in left hip: Secondary | ICD-10-CM | POA: Diagnosis not present

## 2022-02-07 DIAGNOSIS — E039 Hypothyroidism, unspecified: Secondary | ICD-10-CM | POA: Diagnosis not present

## 2022-02-07 DIAGNOSIS — Z008 Encounter for other general examination: Secondary | ICD-10-CM | POA: Diagnosis not present

## 2022-02-07 DIAGNOSIS — E271 Primary adrenocortical insufficiency: Secondary | ICD-10-CM | POA: Diagnosis not present

## 2022-02-07 DIAGNOSIS — G4700 Insomnia, unspecified: Secondary | ICD-10-CM | POA: Diagnosis not present

## 2022-02-07 DIAGNOSIS — I10 Essential (primary) hypertension: Secondary | ICD-10-CM | POA: Diagnosis not present

## 2022-02-07 DIAGNOSIS — I25119 Atherosclerotic heart disease of native coronary artery with unspecified angina pectoris: Secondary | ICD-10-CM | POA: Diagnosis not present

## 2022-02-07 DIAGNOSIS — E559 Vitamin D deficiency, unspecified: Secondary | ICD-10-CM | POA: Diagnosis not present

## 2022-02-07 DIAGNOSIS — D649 Anemia, unspecified: Secondary | ICD-10-CM | POA: Diagnosis not present

## 2022-02-07 DIAGNOSIS — E785 Hyperlipidemia, unspecified: Secondary | ICD-10-CM | POA: Diagnosis not present

## 2022-02-07 DIAGNOSIS — E669 Obesity, unspecified: Secondary | ICD-10-CM | POA: Diagnosis not present

## 2022-02-07 DIAGNOSIS — I739 Peripheral vascular disease, unspecified: Secondary | ICD-10-CM | POA: Diagnosis not present

## 2022-02-07 DIAGNOSIS — M810 Age-related osteoporosis without current pathological fracture: Secondary | ICD-10-CM | POA: Diagnosis not present

## 2022-02-07 DIAGNOSIS — I252 Old myocardial infarction: Secondary | ICD-10-CM | POA: Diagnosis not present

## 2022-02-08 DIAGNOSIS — E2749 Other adrenocortical insufficiency: Secondary | ICD-10-CM | POA: Diagnosis not present

## 2022-02-08 DIAGNOSIS — E039 Hypothyroidism, unspecified: Secondary | ICD-10-CM | POA: Diagnosis not present

## 2022-02-09 DIAGNOSIS — E2749 Other adrenocortical insufficiency: Secondary | ICD-10-CM | POA: Diagnosis not present

## 2022-02-09 DIAGNOSIS — E7800 Pure hypercholesterolemia, unspecified: Secondary | ICD-10-CM | POA: Diagnosis not present

## 2022-02-09 DIAGNOSIS — I10 Essential (primary) hypertension: Secondary | ICD-10-CM | POA: Diagnosis not present

## 2022-02-09 DIAGNOSIS — E039 Hypothyroidism, unspecified: Secondary | ICD-10-CM | POA: Diagnosis not present

## 2022-02-28 DIAGNOSIS — M25552 Pain in left hip: Secondary | ICD-10-CM | POA: Diagnosis not present

## 2022-03-06 DIAGNOSIS — I5020 Unspecified systolic (congestive) heart failure: Secondary | ICD-10-CM | POA: Diagnosis not present

## 2022-03-06 DIAGNOSIS — Z4509 Encounter for adjustment and management of other cardiac device: Secondary | ICD-10-CM | POA: Diagnosis not present

## 2022-03-06 DIAGNOSIS — I110 Hypertensive heart disease with heart failure: Secondary | ICD-10-CM | POA: Diagnosis not present

## 2022-03-06 DIAGNOSIS — Z87891 Personal history of nicotine dependence: Secondary | ICD-10-CM | POA: Diagnosis not present

## 2022-03-07 DIAGNOSIS — N183 Chronic kidney disease, stage 3 unspecified: Secondary | ICD-10-CM | POA: Diagnosis not present

## 2022-03-07 DIAGNOSIS — E039 Hypothyroidism, unspecified: Secondary | ICD-10-CM | POA: Diagnosis not present

## 2022-03-07 DIAGNOSIS — Z79899 Other long term (current) drug therapy: Secondary | ICD-10-CM | POA: Diagnosis not present

## 2022-03-07 DIAGNOSIS — Z6829 Body mass index (BMI) 29.0-29.9, adult: Secondary | ICD-10-CM | POA: Diagnosis not present

## 2022-03-07 DIAGNOSIS — I739 Peripheral vascular disease, unspecified: Secondary | ICD-10-CM | POA: Diagnosis not present

## 2022-03-07 DIAGNOSIS — E7800 Pure hypercholesterolemia, unspecified: Secondary | ICD-10-CM | POA: Diagnosis not present

## 2022-03-11 DIAGNOSIS — Z4502 Encounter for adjustment and management of automatic implantable cardiac defibrillator: Secondary | ICD-10-CM | POA: Diagnosis not present

## 2022-03-11 DIAGNOSIS — I429 Cardiomyopathy, unspecified: Secondary | ICD-10-CM | POA: Diagnosis not present

## 2022-03-15 DIAGNOSIS — E2749 Other adrenocortical insufficiency: Secondary | ICD-10-CM | POA: Diagnosis not present

## 2022-03-20 DIAGNOSIS — J209 Acute bronchitis, unspecified: Secondary | ICD-10-CM | POA: Diagnosis not present

## 2022-04-04 ENCOUNTER — Other Ambulatory Visit: Payer: Self-pay | Admitting: *Deleted

## 2022-04-04 DIAGNOSIS — R0989 Other specified symptoms and signs involving the circulatory and respiratory systems: Secondary | ICD-10-CM

## 2022-04-16 ENCOUNTER — Ambulatory Visit (HOSPITAL_COMMUNITY)
Admission: RE | Admit: 2022-04-16 | Discharge: 2022-04-16 | Disposition: A | Discharge status: Home / Self Care | Payer: Medicare HMO | Source: Ambulatory Visit | Attending: Surgery | Admitting: Surgery

## 2022-04-16 ENCOUNTER — Encounter: Payer: Self-pay | Admitting: Surgery

## 2022-04-16 ENCOUNTER — Ambulatory Visit: Payer: Medicare HMO | Admitting: Surgery

## 2022-04-16 VITALS — BP 134/68 | HR 60 | Temp 97.7°F | Resp 20 | Ht 61.0 in | Wt 165.0 lb

## 2022-04-16 DIAGNOSIS — R0989 Other specified symptoms and signs involving the circulatory and respiratory systems: Secondary | ICD-10-CM | POA: Diagnosis not present

## 2022-04-16 DIAGNOSIS — I70212 Atherosclerosis of native arteries of extremities with intermittent claudication, left leg: Secondary | ICD-10-CM | POA: Diagnosis not present

## 2022-04-16 NOTE — Progress Notes (Signed)
[facility] [place]  Patient name: Teresa Santiago MRN: [id number] DOB: 19-Apr-1946 Sex: female   REQUESTING PROVIDER:    Dr. Helene Kelp   REASON FOR CONSULT:    Abnormal home screening  HISTORY OF PRESENT ILLNESS:   Teresa Santiago is a 76 y.o. female, who is referred today for evaluation of left leg claudication.  The patient states that she can walk approximately 1 block before she has to stop because of left calf pain.  She denies any rest pain.  She does not have any ulcers.  Patient has a significant cardiac history.  She is status post MI with PCI.  She has an ICD in place.  She is medically managed for hypertension.  She takes a statin for hypercholesterolemia.  She has a history of smoking.  PAST MEDICAL HISTORY    Past Medical History:  Diagnosis Date   Allergy    Hyperlipidemia    Hypertension    Myocardial infarction [facility])    Thyroid disease      FAMILY HISTORY   History reviewed. No pertinent family history.  SOCIAL HISTORY:   Social History   Socioeconomic History   Marital status: Married    Spouse name: Not on file   Number of children: Not on file   Years of education: Not on file   Highest education level: Not on file  Occupational History   Not on file  Tobacco Use   Smoking status: Former    Types: Cigarettes   Smokeless tobacco: Never  Vaping Use   Vaping Use: Never used  Substance and Sexual Activity   Alcohol use: Never   Drug use: Never   Sexual activity: Not on file  Other Topics Concern   Not on file  Social History Narrative   Not on file   Social Determinants of Health   Financial Resource Strain: Not on file  Food Insecurity: Not on file  Transportation Needs: Not on file  Physical Activity: Not on file  Stress: Not on file  Social Connections: Not on file  Intimate Partner Violence: Not on file    ALLERGIES:    Allergies  Allergen Reactions   Codeine Nausea And  Vomiting    CURRENT MEDICATIONS:    Current Outpatient Medications  Medication Sig Dispense Refill   aspirin EC 81 MG tablet Take by mouth.     atorvastatin (LIPITOR) 40 MG tablet Take 40 mg by mouth daily.     Calcium Carbonate-Vitamin D (OYSTER SHELL CALCIUM/D) 500-5 MG-MCG TABS Take by mouth.     carvedilol (COREG) 25 MG tablet Take by mouth.     Cholecalciferol 25 MCG (1000 UT) tablet Take by mouth.     clopidogrel (PLAVIX) 75 MG tablet Take 75 mg by mouth daily.     cyanocobalamin 100 MCG tablet cyanocobalamin (vit B-12) 100 mcg tablet     hydrALAZINE (APRESOLINE) 10 MG tablet hydralazine 10 mg tablet     levothyroxine (SYNTHROID) 25 MCG tablet Take 25 mcg by mouth daily.     losartan (COZAAR) 25 MG tablet Take 25 mg by mouth daily.     pantoprazole (PROTONIX) 40 MG tablet Take 40 mg by mouth daily.     traMADol (ULTRAM) 50 MG tablet Take 50 mg by mouth 3 (three) times daily as needed.     amitriptyline (ELAVIL) 25 MG tablet amitriptyline 25 mg tablet (Patient not taking: Reported on 04/16/2022)     azelastine (ASTELIN) 0.1 % nasal spray  azelastine 137 mcg (0.1 %) nasal spray aerosol (Patient not taking: Reported on 04/16/2022)     furosemide (LASIX) 20 MG tablet furosemide 20 mg tablet  prn (Patient not taking: Reported on 04/16/2022)     isosorbide mononitrate (IMDUR) 30 MG 24 hr tablet Take 30 mg by mouth daily. (Patient not taking: Reported on 04/16/2022)     No current facility-administered medications for this visit.    REVIEW OF SYSTEMS:   '[X]'$  denotes positive finding, '[ ]'$  denotes negative finding Cardiac  Comments:  Chest pain or chest pressure:    Shortness of breath upon exertion:    Short of breath when lying flat:    Irregular heart rhythm:        Vascular    Pain in calf, thigh, or hip brought on by ambulation: x   Pain in feet at night that wakes you up from your sleep:     Blood clot in your veins:    Leg swelling:         Pulmonary    Oxygen at home:     Productive cough:     Wheezing:         Neurologic    Sudden weakness in arms or legs:     Sudden numbness in arms or legs:     Sudden onset of difficulty speaking or slurred speech:    Temporary loss of vision in one eye:     Problems with dizziness:         Gastrointestinal    Blood in stool:      Vomited blood:         Genitourinary    Burning when urinating:     Blood in urine:        Psychiatric    Major depression:         Hematologic    Bleeding problems:    Problems with blood clotting too easily:        Skin    Rashes or ulcers:        Constitutional    Fever or chills:     PHYSICAL EXAM:   Vitals:   04/16/22 1154  BP: 134/68  Pulse: 60  Resp: 20  Temp: 97.7 F (36.5 C)  SpO2: 98%  Weight: 165 lb (74.8 kg)  Height: '5\' 1"'$  (1.549 m)    GENERAL: The patient is a well-nourished female, in no acute distress. The vital signs are documented above. CARDIAC: There is a regular rate and rhythm.  VASCULAR: Nonpalpable pedal pulses PULMONARY: Nonlabored respirations MUSCULOSKELETAL: There are no major deformities or cyanosis. NEUROLOGIC: No focal weakness or paresthesias are detected. SKIN: There are no ulcers or rashes noted. PSYCHIATRIC: The patient has a normal affect.  STUDIES:   I have reviewed the following: +-------+-----------+-----------+------------+------------+  ABI/TBIToday's ABIToday's TBIPrevious ABIPrevious TBI  +-------+-----------+-----------+------------+------------+  Right  0.96       0.55                                 +-------+-----------+-----------+------------+------------+  Left   0.72       0.52                                 +-------+-----------+-----------+------------+------------+  Right toe pressure = 90 Left toe pressure = 84 ASSESSMENT and PLAN   Claudication: The patient's ABIs on the left  are 0.72 with symptoms at approximately 1 block.  She does not describe the symptoms as being lifestyle  limiting.  She is without rest pain or nonhealing wounds.  Therefore, I discussed the importance of treating her medically.  She is on all of the appropriate medications for PAD because of her CAD.  I stressed the importance of contacting me if she has any change in her symptoms or if she develops a nonhealing wound.  I told her she needs to check her feet daily.  I have her scheduled for follow-up in 1 year with repeat ABIs   Leia Alf, MD, FACS [facility] [phone] Pager [phone]

## 2022-06-05 DIAGNOSIS — Z4502 Encounter for adjustment and management of automatic implantable cardiac defibrillator: Secondary | ICD-10-CM | POA: Diagnosis not present

## 2022-06-05 DIAGNOSIS — R001 Bradycardia, unspecified: Secondary | ICD-10-CM | POA: Diagnosis not present

## 2022-06-05 DIAGNOSIS — I517 Cardiomegaly: Secondary | ICD-10-CM | POA: Diagnosis not present

## 2022-06-05 DIAGNOSIS — I5022 Chronic systolic (congestive) heart failure: Secondary | ICD-10-CM | POA: Diagnosis not present

## 2022-06-10 DIAGNOSIS — Z4502 Encounter for adjustment and management of automatic implantable cardiac defibrillator: Secondary | ICD-10-CM | POA: Diagnosis not present

## 2022-06-19 DIAGNOSIS — R001 Bradycardia, unspecified: Secondary | ICD-10-CM | POA: Diagnosis not present

## 2022-06-19 DIAGNOSIS — Z885 Allergy status to narcotic agent status: Secondary | ICD-10-CM | POA: Diagnosis not present

## 2022-06-19 DIAGNOSIS — I700 Atherosclerosis of aorta: Secondary | ICD-10-CM | POA: Diagnosis not present

## 2022-06-19 DIAGNOSIS — R112 Nausea with vomiting, unspecified: Secondary | ICD-10-CM | POA: Diagnosis not present

## 2022-06-19 DIAGNOSIS — R109 Unspecified abdominal pain: Secondary | ICD-10-CM | POA: Diagnosis not present

## 2022-06-19 DIAGNOSIS — Z87891 Personal history of nicotine dependence: Secondary | ICD-10-CM | POA: Diagnosis not present

## 2022-06-19 DIAGNOSIS — Z20822 Contact with and (suspected) exposure to covid-19: Secondary | ICD-10-CM | POA: Diagnosis not present

## 2022-06-19 DIAGNOSIS — R197 Diarrhea, unspecified: Secondary | ICD-10-CM | POA: Diagnosis not present

## 2022-06-19 DIAGNOSIS — K3189 Other diseases of stomach and duodenum: Secondary | ICD-10-CM | POA: Diagnosis not present

## 2022-06-19 DIAGNOSIS — R1032 Left lower quadrant pain: Secondary | ICD-10-CM | POA: Diagnosis not present

## 2022-06-19 DIAGNOSIS — I959 Hypotension, unspecified: Secondary | ICD-10-CM | POA: Diagnosis not present

## 2022-06-19 DIAGNOSIS — M8448XS Pathological fracture, other site, sequela: Secondary | ICD-10-CM | POA: Diagnosis not present

## 2022-06-19 DIAGNOSIS — K449 Diaphragmatic hernia without obstruction or gangrene: Secondary | ICD-10-CM | POA: Diagnosis not present

## 2022-06-19 DIAGNOSIS — K760 Fatty (change of) liver, not elsewhere classified: Secondary | ICD-10-CM | POA: Diagnosis not present

## 2022-06-19 DIAGNOSIS — E860 Dehydration: Secondary | ICD-10-CM | POA: Diagnosis not present

## 2022-06-20 DIAGNOSIS — I209 Angina pectoris, unspecified: Secondary | ICD-10-CM | POA: Diagnosis not present

## 2022-06-20 DIAGNOSIS — R001 Bradycardia, unspecified: Secondary | ICD-10-CM | POA: Diagnosis not present

## 2022-06-20 DIAGNOSIS — I700 Atherosclerosis of aorta: Secondary | ICD-10-CM | POA: Diagnosis not present

## 2022-06-20 DIAGNOSIS — R109 Unspecified abdominal pain: Secondary | ICD-10-CM | POA: Diagnosis not present

## 2022-06-20 DIAGNOSIS — K760 Fatty (change of) liver, not elsewhere classified: Secondary | ICD-10-CM | POA: Diagnosis not present

## 2022-07-12 DIAGNOSIS — E7800 Pure hypercholesterolemia, unspecified: Secondary | ICD-10-CM | POA: Diagnosis not present

## 2022-07-12 DIAGNOSIS — I10 Essential (primary) hypertension: Secondary | ICD-10-CM | POA: Diagnosis not present

## 2022-08-27 DIAGNOSIS — Z955 Presence of coronary angioplasty implant and graft: Secondary | ICD-10-CM | POA: Diagnosis not present

## 2022-08-27 DIAGNOSIS — E871 Hypo-osmolality and hyponatremia: Secondary | ICD-10-CM | POA: Diagnosis not present

## 2022-08-27 DIAGNOSIS — E2740 Unspecified adrenocortical insufficiency: Secondary | ICD-10-CM | POA: Diagnosis not present

## 2022-08-27 DIAGNOSIS — G8929 Other chronic pain: Secondary | ICD-10-CM | POA: Diagnosis not present

## 2022-08-27 DIAGNOSIS — Z95810 Presence of automatic (implantable) cardiac defibrillator: Secondary | ICD-10-CM | POA: Diagnosis not present

## 2022-08-27 DIAGNOSIS — I130 Hypertensive heart and chronic kidney disease with heart failure and stage 1 through stage 4 chronic kidney disease, or unspecified chronic kidney disease: Secondary | ICD-10-CM | POA: Diagnosis not present

## 2022-08-27 DIAGNOSIS — N189 Chronic kidney disease, unspecified: Secondary | ICD-10-CM | POA: Diagnosis not present

## 2022-08-27 DIAGNOSIS — I2510 Atherosclerotic heart disease of native coronary artery without angina pectoris: Secondary | ICD-10-CM | POA: Diagnosis not present

## 2022-08-27 DIAGNOSIS — I252 Old myocardial infarction: Secondary | ICD-10-CM | POA: Diagnosis not present

## 2022-08-27 DIAGNOSIS — I739 Peripheral vascular disease, unspecified: Secondary | ICD-10-CM | POA: Diagnosis not present

## 2022-08-27 DIAGNOSIS — E785 Hyperlipidemia, unspecified: Secondary | ICD-10-CM | POA: Diagnosis not present

## 2022-08-27 DIAGNOSIS — I5022 Chronic systolic (congestive) heart failure: Secondary | ICD-10-CM | POA: Diagnosis not present

## 2022-09-05 DIAGNOSIS — E785 Hyperlipidemia, unspecified: Secondary | ICD-10-CM | POA: Diagnosis not present

## 2022-09-05 DIAGNOSIS — I25118 Atherosclerotic heart disease of native coronary artery with other forms of angina pectoris: Secondary | ICD-10-CM | POA: Diagnosis not present

## 2022-09-05 DIAGNOSIS — I509 Heart failure, unspecified: Secondary | ICD-10-CM | POA: Diagnosis not present

## 2022-09-05 DIAGNOSIS — I252 Old myocardial infarction: Secondary | ICD-10-CM | POA: Diagnosis not present

## 2022-09-05 DIAGNOSIS — E039 Hypothyroidism, unspecified: Secondary | ICD-10-CM | POA: Diagnosis not present

## 2022-09-05 DIAGNOSIS — D509 Iron deficiency anemia, unspecified: Secondary | ICD-10-CM | POA: Diagnosis not present

## 2022-09-05 DIAGNOSIS — I10 Essential (primary) hypertension: Secondary | ICD-10-CM | POA: Diagnosis not present

## 2022-09-05 DIAGNOSIS — E559 Vitamin D deficiency, unspecified: Secondary | ICD-10-CM | POA: Diagnosis not present

## 2022-09-05 DIAGNOSIS — E279 Disorder of adrenal gland, unspecified: Secondary | ICD-10-CM | POA: Diagnosis not present

## 2022-09-05 DIAGNOSIS — N189 Chronic kidney disease, unspecified: Secondary | ICD-10-CM | POA: Diagnosis not present

## 2022-09-05 DIAGNOSIS — E538 Deficiency of other specified B group vitamins: Secondary | ICD-10-CM | POA: Diagnosis not present

## 2022-09-05 DIAGNOSIS — K219 Gastro-esophageal reflux disease without esophagitis: Secondary | ICD-10-CM | POA: Diagnosis not present

## 2022-09-09 DIAGNOSIS — Z4502 Encounter for adjustment and management of automatic implantable cardiac defibrillator: Secondary | ICD-10-CM | POA: Diagnosis not present

## 2022-09-10 DIAGNOSIS — E039 Hypothyroidism, unspecified: Secondary | ICD-10-CM | POA: Diagnosis not present

## 2022-09-10 DIAGNOSIS — E2749 Other adrenocortical insufficiency: Secondary | ICD-10-CM | POA: Diagnosis not present

## 2022-09-11 DIAGNOSIS — I10 Essential (primary) hypertension: Secondary | ICD-10-CM | POA: Diagnosis not present

## 2022-09-11 DIAGNOSIS — E538 Deficiency of other specified B group vitamins: Secondary | ICD-10-CM | POA: Diagnosis not present

## 2022-09-11 DIAGNOSIS — I700 Atherosclerosis of aorta: Secondary | ICD-10-CM | POA: Diagnosis not present

## 2022-09-11 DIAGNOSIS — Z79899 Other long term (current) drug therapy: Secondary | ICD-10-CM | POA: Diagnosis not present

## 2022-09-11 DIAGNOSIS — Z0001 Encounter for general adult medical examination with abnormal findings: Secondary | ICD-10-CM | POA: Diagnosis not present

## 2022-09-11 DIAGNOSIS — E669 Obesity, unspecified: Secondary | ICD-10-CM | POA: Diagnosis not present

## 2022-09-11 DIAGNOSIS — K635 Polyp of colon: Secondary | ICD-10-CM | POA: Diagnosis not present

## 2022-09-11 DIAGNOSIS — J439 Emphysema, unspecified: Secondary | ICD-10-CM | POA: Diagnosis not present

## 2022-09-11 DIAGNOSIS — Z23 Encounter for immunization: Secondary | ICD-10-CM | POA: Diagnosis not present

## 2022-09-11 DIAGNOSIS — E559 Vitamin D deficiency, unspecified: Secondary | ICD-10-CM | POA: Diagnosis not present

## 2022-09-11 DIAGNOSIS — I70209 Unspecified atherosclerosis of native arteries of extremities, unspecified extremity: Secondary | ICD-10-CM | POA: Diagnosis not present

## 2022-09-11 DIAGNOSIS — H9191 Unspecified hearing loss, right ear: Secondary | ICD-10-CM | POA: Diagnosis not present

## 2022-09-12 DIAGNOSIS — E039 Hypothyroidism, unspecified: Secondary | ICD-10-CM | POA: Diagnosis not present

## 2022-09-12 DIAGNOSIS — E2749 Other adrenocortical insufficiency: Secondary | ICD-10-CM | POA: Diagnosis not present

## 2022-09-25 DIAGNOSIS — E2740 Unspecified adrenocortical insufficiency: Secondary | ICD-10-CM | POA: Diagnosis not present

## 2022-09-25 DIAGNOSIS — Z955 Presence of coronary angioplasty implant and graft: Secondary | ICD-10-CM | POA: Diagnosis not present

## 2022-09-25 DIAGNOSIS — G8929 Other chronic pain: Secondary | ICD-10-CM | POA: Diagnosis not present

## 2022-09-25 DIAGNOSIS — E785 Hyperlipidemia, unspecified: Secondary | ICD-10-CM | POA: Diagnosis not present

## 2022-09-25 DIAGNOSIS — I739 Peripheral vascular disease, unspecified: Secondary | ICD-10-CM | POA: Diagnosis not present

## 2022-09-25 DIAGNOSIS — N183 Chronic kidney disease, stage 3 unspecified: Secondary | ICD-10-CM | POA: Diagnosis not present

## 2022-09-25 DIAGNOSIS — I5020 Unspecified systolic (congestive) heart failure: Secondary | ICD-10-CM | POA: Diagnosis not present

## 2022-09-25 DIAGNOSIS — N189 Chronic kidney disease, unspecified: Secondary | ICD-10-CM | POA: Diagnosis not present

## 2022-09-25 DIAGNOSIS — I5022 Chronic systolic (congestive) heart failure: Secondary | ICD-10-CM | POA: Diagnosis not present

## 2022-09-25 DIAGNOSIS — I252 Old myocardial infarction: Secondary | ICD-10-CM | POA: Diagnosis not present

## 2022-09-25 DIAGNOSIS — I2510 Atherosclerotic heart disease of native coronary artery without angina pectoris: Secondary | ICD-10-CM | POA: Diagnosis not present

## 2022-09-25 DIAGNOSIS — I130 Hypertensive heart and chronic kidney disease with heart failure and stage 1 through stage 4 chronic kidney disease, or unspecified chronic kidney disease: Secondary | ICD-10-CM | POA: Diagnosis not present

## 2022-09-25 DIAGNOSIS — Z87891 Personal history of nicotine dependence: Secondary | ICD-10-CM | POA: Diagnosis not present

## 2022-09-25 DIAGNOSIS — Z95810 Presence of automatic (implantable) cardiac defibrillator: Secondary | ICD-10-CM | POA: Diagnosis not present

## 2022-10-17 DIAGNOSIS — R011 Cardiac murmur, unspecified: Secondary | ICD-10-CM | POA: Diagnosis not present

## 2022-10-17 DIAGNOSIS — E669 Obesity, unspecified: Secondary | ICD-10-CM | POA: Diagnosis not present

## 2022-10-17 DIAGNOSIS — I5022 Chronic systolic (congestive) heart failure: Secondary | ICD-10-CM | POA: Diagnosis not present

## 2022-10-17 DIAGNOSIS — J309 Allergic rhinitis, unspecified: Secondary | ICD-10-CM | POA: Diagnosis not present

## 2022-10-17 DIAGNOSIS — Z6832 Body mass index (BMI) 32.0-32.9, adult: Secondary | ICD-10-CM | POA: Diagnosis not present

## 2022-10-17 DIAGNOSIS — I10 Essential (primary) hypertension: Secondary | ICD-10-CM | POA: Diagnosis not present

## 2022-10-23 DIAGNOSIS — I5022 Chronic systolic (congestive) heart failure: Secondary | ICD-10-CM | POA: Diagnosis not present

## 2022-10-23 DIAGNOSIS — I252 Old myocardial infarction: Secondary | ICD-10-CM | POA: Diagnosis not present

## 2022-10-23 DIAGNOSIS — N189 Chronic kidney disease, unspecified: Secondary | ICD-10-CM | POA: Diagnosis not present

## 2022-10-23 DIAGNOSIS — E785 Hyperlipidemia, unspecified: Secondary | ICD-10-CM | POA: Diagnosis not present

## 2022-10-23 DIAGNOSIS — Z955 Presence of coronary angioplasty implant and graft: Secondary | ICD-10-CM | POA: Diagnosis not present

## 2022-10-23 DIAGNOSIS — I739 Peripheral vascular disease, unspecified: Secondary | ICD-10-CM | POA: Diagnosis not present

## 2022-10-23 DIAGNOSIS — I5020 Unspecified systolic (congestive) heart failure: Secondary | ICD-10-CM | POA: Diagnosis not present

## 2022-10-23 DIAGNOSIS — I130 Hypertensive heart and chronic kidney disease with heart failure and stage 1 through stage 4 chronic kidney disease, or unspecified chronic kidney disease: Secondary | ICD-10-CM | POA: Diagnosis not present

## 2022-10-23 DIAGNOSIS — Z95810 Presence of automatic (implantable) cardiac defibrillator: Secondary | ICD-10-CM | POA: Diagnosis not present

## 2022-10-23 DIAGNOSIS — Z7982 Long term (current) use of aspirin: Secondary | ICD-10-CM | POA: Diagnosis not present

## 2022-10-23 DIAGNOSIS — E2740 Unspecified adrenocortical insufficiency: Secondary | ICD-10-CM | POA: Diagnosis not present

## 2022-10-23 DIAGNOSIS — Z7902 Long term (current) use of antithrombotics/antiplatelets: Secondary | ICD-10-CM | POA: Diagnosis not present

## 2022-10-23 DIAGNOSIS — I2510 Atherosclerotic heart disease of native coronary artery without angina pectoris: Secondary | ICD-10-CM | POA: Diagnosis not present

## 2022-10-23 DIAGNOSIS — E669 Obesity, unspecified: Secondary | ICD-10-CM | POA: Diagnosis not present

## 2022-11-28 DIAGNOSIS — I2510 Atherosclerotic heart disease of native coronary artery without angina pectoris: Secondary | ICD-10-CM | POA: Diagnosis not present

## 2022-11-28 DIAGNOSIS — E039 Hypothyroidism, unspecified: Secondary | ICD-10-CM | POA: Diagnosis not present

## 2022-11-28 DIAGNOSIS — D510 Vitamin B12 deficiency anemia due to intrinsic factor deficiency: Secondary | ICD-10-CM | POA: Diagnosis not present

## 2022-11-28 DIAGNOSIS — I739 Peripheral vascular disease, unspecified: Secondary | ICD-10-CM | POA: Diagnosis not present

## 2022-11-28 DIAGNOSIS — E785 Hyperlipidemia, unspecified: Secondary | ICD-10-CM | POA: Diagnosis not present

## 2022-11-28 DIAGNOSIS — I509 Heart failure, unspecified: Secondary | ICD-10-CM | POA: Diagnosis not present

## 2022-12-04 DIAGNOSIS — Z95810 Presence of automatic (implantable) cardiac defibrillator: Secondary | ICD-10-CM | POA: Diagnosis not present

## 2022-12-04 DIAGNOSIS — I429 Cardiomyopathy, unspecified: Secondary | ICD-10-CM | POA: Diagnosis not present

## 2022-12-12 DIAGNOSIS — Z9861 Coronary angioplasty status: Secondary | ICD-10-CM | POA: Diagnosis not present

## 2022-12-12 DIAGNOSIS — E7800 Pure hypercholesterolemia, unspecified: Secondary | ICD-10-CM | POA: Diagnosis not present

## 2022-12-12 DIAGNOSIS — I2510 Atherosclerotic heart disease of native coronary artery without angina pectoris: Secondary | ICD-10-CM | POA: Diagnosis not present

## 2022-12-12 DIAGNOSIS — I5020 Unspecified systolic (congestive) heart failure: Secondary | ICD-10-CM | POA: Diagnosis not present

## 2022-12-12 DIAGNOSIS — Z95810 Presence of automatic (implantable) cardiac defibrillator: Secondary | ICD-10-CM | POA: Diagnosis not present

## 2022-12-12 DIAGNOSIS — I5022 Chronic systolic (congestive) heart failure: Secondary | ICD-10-CM | POA: Diagnosis not present

## 2022-12-12 DIAGNOSIS — I10 Essential (primary) hypertension: Secondary | ICD-10-CM | POA: Diagnosis not present

## 2022-12-17 DIAGNOSIS — E6609 Other obesity due to excess calories: Secondary | ICD-10-CM | POA: Diagnosis not present

## 2022-12-17 DIAGNOSIS — E039 Hypothyroidism, unspecified: Secondary | ICD-10-CM | POA: Diagnosis not present

## 2022-12-17 DIAGNOSIS — Z95810 Presence of automatic (implantable) cardiac defibrillator: Secondary | ICD-10-CM | POA: Diagnosis not present

## 2022-12-17 DIAGNOSIS — E538 Deficiency of other specified B group vitamins: Secondary | ICD-10-CM | POA: Diagnosis not present

## 2022-12-17 DIAGNOSIS — I5020 Unspecified systolic (congestive) heart failure: Secondary | ICD-10-CM | POA: Diagnosis not present

## 2022-12-17 DIAGNOSIS — G8929 Other chronic pain: Secondary | ICD-10-CM | POA: Diagnosis not present

## 2022-12-17 DIAGNOSIS — E7849 Other hyperlipidemia: Secondary | ICD-10-CM | POA: Diagnosis not present

## 2022-12-17 DIAGNOSIS — E2749 Other adrenocortical insufficiency: Secondary | ICD-10-CM | POA: Diagnosis not present

## 2022-12-17 DIAGNOSIS — M545 Low back pain, unspecified: Secondary | ICD-10-CM | POA: Diagnosis not present

## 2022-12-17 DIAGNOSIS — N183 Chronic kidney disease, stage 3 unspecified: Secondary | ICD-10-CM | POA: Diagnosis not present

## 2022-12-17 DIAGNOSIS — I2510 Atherosclerotic heart disease of native coronary artery without angina pectoris: Secondary | ICD-10-CM | POA: Diagnosis not present

## 2023-01-01 DIAGNOSIS — M5136 Other intervertebral disc degeneration, lumbar region: Secondary | ICD-10-CM | POA: Diagnosis not present

## 2023-01-01 DIAGNOSIS — G8929 Other chronic pain: Secondary | ICD-10-CM | POA: Diagnosis not present

## 2023-01-01 DIAGNOSIS — M4316 Spondylolisthesis, lumbar region: Secondary | ICD-10-CM | POA: Diagnosis not present

## 2023-01-01 DIAGNOSIS — M4696 Unspecified inflammatory spondylopathy, lumbar region: Secondary | ICD-10-CM | POA: Diagnosis not present

## 2023-01-01 DIAGNOSIS — M545 Low back pain, unspecified: Secondary | ICD-10-CM | POA: Diagnosis not present

## 2023-01-03 DIAGNOSIS — I2510 Atherosclerotic heart disease of native coronary artery without angina pectoris: Secondary | ICD-10-CM | POA: Diagnosis not present

## 2023-01-03 DIAGNOSIS — I5022 Chronic systolic (congestive) heart failure: Secondary | ICD-10-CM | POA: Diagnosis not present

## 2023-01-03 DIAGNOSIS — N183 Chronic kidney disease, stage 3 unspecified: Secondary | ICD-10-CM | POA: Diagnosis not present

## 2023-01-21 DIAGNOSIS — R011 Cardiac murmur, unspecified: Secondary | ICD-10-CM | POA: Diagnosis not present

## 2023-01-21 DIAGNOSIS — M160 Bilateral primary osteoarthritis of hip: Secondary | ICD-10-CM | POA: Diagnosis not present

## 2023-01-21 DIAGNOSIS — M25559 Pain in unspecified hip: Secondary | ICD-10-CM | POA: Diagnosis not present

## 2023-01-21 DIAGNOSIS — E279 Disorder of adrenal gland, unspecified: Secondary | ICD-10-CM | POA: Diagnosis not present

## 2023-01-21 DIAGNOSIS — D6869 Other thrombophilia: Secondary | ICD-10-CM | POA: Diagnosis not present

## 2023-01-21 DIAGNOSIS — N2581 Secondary hyperparathyroidism of renal origin: Secondary | ICD-10-CM | POA: Diagnosis not present

## 2023-01-21 DIAGNOSIS — I252 Old myocardial infarction: Secondary | ICD-10-CM | POA: Diagnosis not present

## 2023-01-21 DIAGNOSIS — J439 Emphysema, unspecified: Secondary | ICD-10-CM | POA: Diagnosis not present

## 2023-01-21 DIAGNOSIS — I70209 Unspecified atherosclerosis of native arteries of extremities, unspecified extremity: Secondary | ICD-10-CM | POA: Diagnosis not present

## 2023-01-21 DIAGNOSIS — R52 Pain, unspecified: Secondary | ICD-10-CM | POA: Diagnosis not present

## 2023-01-21 DIAGNOSIS — Z23 Encounter for immunization: Secondary | ICD-10-CM | POA: Diagnosis not present

## 2023-01-21 DIAGNOSIS — I5022 Chronic systolic (congestive) heart failure: Secondary | ICD-10-CM | POA: Diagnosis not present

## 2023-01-21 DIAGNOSIS — E669 Obesity, unspecified: Secondary | ICD-10-CM | POA: Diagnosis not present

## 2023-01-21 DIAGNOSIS — N1832 Chronic kidney disease, stage 3b: Secondary | ICD-10-CM | POA: Diagnosis not present

## 2023-02-04 DIAGNOSIS — M79604 Pain in right leg: Secondary | ICD-10-CM | POA: Diagnosis not present

## 2023-02-10 DIAGNOSIS — I519 Heart disease, unspecified: Secondary | ICD-10-CM | POA: Diagnosis not present

## 2023-02-10 DIAGNOSIS — K219 Gastro-esophageal reflux disease without esophagitis: Secondary | ICD-10-CM | POA: Diagnosis not present

## 2023-02-10 DIAGNOSIS — I10 Essential (primary) hypertension: Secondary | ICD-10-CM | POA: Diagnosis not present

## 2023-02-10 DIAGNOSIS — N183 Chronic kidney disease, stage 3 unspecified: Secondary | ICD-10-CM | POA: Diagnosis not present

## 2023-02-12 DIAGNOSIS — E669 Obesity, unspecified: Secondary | ICD-10-CM | POA: Diagnosis not present

## 2023-02-12 DIAGNOSIS — I2510 Atherosclerotic heart disease of native coronary artery without angina pectoris: Secondary | ICD-10-CM | POA: Diagnosis not present

## 2023-02-12 DIAGNOSIS — Z87891 Personal history of nicotine dependence: Secondary | ICD-10-CM | POA: Diagnosis not present

## 2023-02-12 DIAGNOSIS — Z6830 Body mass index (BMI) 30.0-30.9, adult: Secondary | ICD-10-CM | POA: Diagnosis not present

## 2023-02-12 DIAGNOSIS — E871 Hypo-osmolality and hyponatremia: Secondary | ICD-10-CM | POA: Diagnosis not present

## 2023-02-12 DIAGNOSIS — G8929 Other chronic pain: Secondary | ICD-10-CM | POA: Diagnosis not present

## 2023-02-12 DIAGNOSIS — R0600 Dyspnea, unspecified: Secondary | ICD-10-CM | POA: Diagnosis not present

## 2023-02-12 DIAGNOSIS — E538 Deficiency of other specified B group vitamins: Secondary | ICD-10-CM | POA: Diagnosis not present

## 2023-02-12 DIAGNOSIS — Z4502 Encounter for adjustment and management of automatic implantable cardiac defibrillator: Secondary | ICD-10-CM | POA: Diagnosis not present

## 2023-02-12 DIAGNOSIS — N183 Chronic kidney disease, stage 3 unspecified: Secondary | ICD-10-CM | POA: Diagnosis not present

## 2023-02-12 DIAGNOSIS — R001 Bradycardia, unspecified: Secondary | ICD-10-CM | POA: Diagnosis not present

## 2023-02-12 DIAGNOSIS — N189 Chronic kidney disease, unspecified: Secondary | ICD-10-CM | POA: Diagnosis not present

## 2023-02-12 DIAGNOSIS — I130 Hypertensive heart and chronic kidney disease with heart failure and stage 1 through stage 4 chronic kidney disease, or unspecified chronic kidney disease: Secondary | ICD-10-CM | POA: Diagnosis not present

## 2023-02-12 DIAGNOSIS — I5020 Unspecified systolic (congestive) heart failure: Secondary | ICD-10-CM | POA: Diagnosis not present

## 2023-02-12 DIAGNOSIS — M545 Low back pain, unspecified: Secondary | ICD-10-CM | POA: Diagnosis not present

## 2023-02-12 DIAGNOSIS — E785 Hyperlipidemia, unspecified: Secondary | ICD-10-CM | POA: Diagnosis not present

## 2023-02-21 DIAGNOSIS — E039 Hypothyroidism, unspecified: Secondary | ICD-10-CM | POA: Diagnosis not present

## 2023-02-21 DIAGNOSIS — Z6829 Body mass index (BMI) 29.0-29.9, adult: Secondary | ICD-10-CM | POA: Diagnosis not present

## 2023-02-21 DIAGNOSIS — Z79899 Other long term (current) drug therapy: Secondary | ICD-10-CM | POA: Diagnosis not present

## 2023-02-21 DIAGNOSIS — I739 Peripheral vascular disease, unspecified: Secondary | ICD-10-CM | POA: Diagnosis not present

## 2023-02-21 DIAGNOSIS — Z1331 Encounter for screening for depression: Secondary | ICD-10-CM | POA: Diagnosis not present

## 2023-02-21 DIAGNOSIS — Z0000 Encounter for general adult medical examination without abnormal findings: Secondary | ICD-10-CM | POA: Diagnosis not present

## 2023-02-21 DIAGNOSIS — M79651 Pain in right thigh: Secondary | ICD-10-CM | POA: Diagnosis not present

## 2023-02-21 DIAGNOSIS — N183 Chronic kidney disease, stage 3 unspecified: Secondary | ICD-10-CM | POA: Diagnosis not present

## 2023-02-25 DIAGNOSIS — R1110 Vomiting, unspecified: Secondary | ICD-10-CM | POA: Diagnosis not present

## 2023-02-25 DIAGNOSIS — R531 Weakness: Secondary | ICD-10-CM | POA: Diagnosis not present

## 2023-02-25 DIAGNOSIS — R112 Nausea with vomiting, unspecified: Secondary | ICD-10-CM | POA: Diagnosis not present

## 2023-02-25 DIAGNOSIS — K591 Functional diarrhea: Secondary | ICD-10-CM | POA: Diagnosis not present

## 2023-02-26 DIAGNOSIS — R197 Diarrhea, unspecified: Secondary | ICD-10-CM | POA: Diagnosis not present

## 2023-02-27 DIAGNOSIS — M545 Low back pain, unspecified: Secondary | ICD-10-CM | POA: Diagnosis not present

## 2023-02-27 DIAGNOSIS — M4316 Spondylolisthesis, lumbar region: Secondary | ICD-10-CM | POA: Diagnosis not present

## 2023-02-27 DIAGNOSIS — G8929 Other chronic pain: Secondary | ICD-10-CM | POA: Diagnosis not present

## 2023-02-27 DIAGNOSIS — M4807 Spinal stenosis, lumbosacral region: Secondary | ICD-10-CM | POA: Diagnosis not present

## 2023-03-12 DIAGNOSIS — E2749 Other adrenocortical insufficiency: Secondary | ICD-10-CM | POA: Diagnosis not present

## 2023-03-12 DIAGNOSIS — E039 Hypothyroidism, unspecified: Secondary | ICD-10-CM | POA: Diagnosis not present

## 2023-03-13 DIAGNOSIS — I5021 Acute systolic (congestive) heart failure: Secondary | ICD-10-CM | POA: Diagnosis not present

## 2023-03-13 DIAGNOSIS — E1122 Type 2 diabetes mellitus with diabetic chronic kidney disease: Secondary | ICD-10-CM | POA: Diagnosis not present

## 2023-03-13 DIAGNOSIS — E039 Hypothyroidism, unspecified: Secondary | ICD-10-CM | POA: Diagnosis not present

## 2023-03-13 DIAGNOSIS — I70209 Unspecified atherosclerosis of native arteries of extremities, unspecified extremity: Secondary | ICD-10-CM | POA: Diagnosis not present

## 2023-03-13 DIAGNOSIS — I959 Hypotension, unspecified: Secondary | ICD-10-CM | POA: Diagnosis not present

## 2023-03-13 DIAGNOSIS — I361 Nonrheumatic tricuspid (valve) insufficiency: Secondary | ICD-10-CM | POA: Diagnosis not present

## 2023-03-13 DIAGNOSIS — Z955 Presence of coronary angioplasty implant and graft: Secondary | ICD-10-CM | POA: Diagnosis not present

## 2023-03-13 DIAGNOSIS — E7800 Pure hypercholesterolemia, unspecified: Secondary | ICD-10-CM | POA: Diagnosis not present

## 2023-03-13 DIAGNOSIS — I2510 Atherosclerotic heart disease of native coronary artery without angina pectoris: Secondary | ICD-10-CM | POA: Diagnosis not present

## 2023-03-13 DIAGNOSIS — I5022 Chronic systolic (congestive) heart failure: Secondary | ICD-10-CM | POA: Diagnosis not present

## 2023-03-13 DIAGNOSIS — D631 Anemia in chronic kidney disease: Secondary | ICD-10-CM | POA: Diagnosis not present

## 2023-03-13 DIAGNOSIS — K219 Gastro-esophageal reflux disease without esophagitis: Secondary | ICD-10-CM | POA: Diagnosis not present

## 2023-03-13 DIAGNOSIS — N183 Chronic kidney disease, stage 3 unspecified: Secondary | ICD-10-CM | POA: Diagnosis not present

## 2023-03-13 DIAGNOSIS — Z79899 Other long term (current) drug therapy: Secondary | ICD-10-CM | POA: Diagnosis not present

## 2023-03-13 DIAGNOSIS — Z7982 Long term (current) use of aspirin: Secondary | ICD-10-CM | POA: Diagnosis not present

## 2023-03-13 DIAGNOSIS — I130 Hypertensive heart and chronic kidney disease with heart failure and stage 1 through stage 4 chronic kidney disease, or unspecified chronic kidney disease: Secondary | ICD-10-CM | POA: Diagnosis not present

## 2023-03-13 DIAGNOSIS — Z7952 Long term (current) use of systemic steroids: Secondary | ICD-10-CM | POA: Diagnosis not present

## 2023-03-13 DIAGNOSIS — E2749 Other adrenocortical insufficiency: Secondary | ICD-10-CM | POA: Diagnosis not present

## 2023-03-13 DIAGNOSIS — R531 Weakness: Secondary | ICD-10-CM | POA: Diagnosis not present

## 2023-03-13 DIAGNOSIS — I252 Old myocardial infarction: Secondary | ICD-10-CM | POA: Diagnosis not present

## 2023-03-13 DIAGNOSIS — Z7902 Long term (current) use of antithrombotics/antiplatelets: Secondary | ICD-10-CM | POA: Diagnosis not present

## 2023-03-13 DIAGNOSIS — Z7984 Long term (current) use of oral hypoglycemic drugs: Secondary | ICD-10-CM | POA: Diagnosis not present

## 2023-03-13 DIAGNOSIS — E1151 Type 2 diabetes mellitus with diabetic peripheral angiopathy without gangrene: Secondary | ICD-10-CM | POA: Diagnosis not present

## 2023-03-13 DIAGNOSIS — Z79890 Hormone replacement therapy (postmenopausal): Secondary | ICD-10-CM | POA: Diagnosis not present

## 2023-03-13 DIAGNOSIS — R001 Bradycardia, unspecified: Secondary | ICD-10-CM | POA: Diagnosis not present

## 2023-03-13 DIAGNOSIS — I255 Ischemic cardiomyopathy: Secondary | ICD-10-CM | POA: Diagnosis not present

## 2023-03-13 DIAGNOSIS — E871 Hypo-osmolality and hyponatremia: Secondary | ICD-10-CM | POA: Diagnosis not present

## 2023-03-13 DIAGNOSIS — I952 Hypotension due to drugs: Secondary | ICD-10-CM | POA: Diagnosis not present

## 2023-03-13 DIAGNOSIS — I509 Heart failure, unspecified: Secondary | ICD-10-CM | POA: Diagnosis not present

## 2023-03-15 DIAGNOSIS — I959 Hypotension, unspecified: Secondary | ICD-10-CM | POA: Diagnosis not present

## 2023-03-17 DIAGNOSIS — I952 Hypotension due to drugs: Secondary | ICD-10-CM | POA: Diagnosis not present

## 2023-03-20 DIAGNOSIS — I952 Hypotension due to drugs: Secondary | ICD-10-CM | POA: Diagnosis not present

## 2023-03-27 DIAGNOSIS — E039 Hypothyroidism, unspecified: Secondary | ICD-10-CM | POA: Diagnosis not present

## 2023-03-27 DIAGNOSIS — I5022 Chronic systolic (congestive) heart failure: Secondary | ICD-10-CM | POA: Diagnosis not present

## 2023-03-27 DIAGNOSIS — I5020 Unspecified systolic (congestive) heart failure: Secondary | ICD-10-CM | POA: Diagnosis not present

## 2023-03-27 DIAGNOSIS — Z9861 Coronary angioplasty status: Secondary | ICD-10-CM | POA: Diagnosis not present

## 2023-03-27 DIAGNOSIS — E2749 Other adrenocortical insufficiency: Secondary | ICD-10-CM | POA: Diagnosis not present

## 2023-03-27 DIAGNOSIS — I255 Ischemic cardiomyopathy: Secondary | ICD-10-CM | POA: Diagnosis not present

## 2023-03-27 DIAGNOSIS — Z95810 Presence of automatic (implantable) cardiac defibrillator: Secondary | ICD-10-CM | POA: Diagnosis not present

## 2023-03-27 DIAGNOSIS — E871 Hypo-osmolality and hyponatremia: Secondary | ICD-10-CM | POA: Diagnosis not present

## 2023-03-27 DIAGNOSIS — I2510 Atherosclerotic heart disease of native coronary artery without angina pectoris: Secondary | ICD-10-CM | POA: Diagnosis not present

## 2023-03-27 DIAGNOSIS — E7849 Other hyperlipidemia: Secondary | ICD-10-CM | POA: Diagnosis not present

## 2023-03-27 DIAGNOSIS — N183 Chronic kidney disease, stage 3 unspecified: Secondary | ICD-10-CM | POA: Diagnosis not present

## 2023-03-27 DIAGNOSIS — I252 Old myocardial infarction: Secondary | ICD-10-CM | POA: Diagnosis not present

## 2023-04-12 ENCOUNTER — Other Ambulatory Visit: Payer: Self-pay | Admitting: *Deleted

## 2023-04-12 DIAGNOSIS — I70212 Atherosclerosis of native arteries of extremities with intermittent claudication, left leg: Secondary | ICD-10-CM

## 2023-04-22 ENCOUNTER — Encounter (HOSPITAL_COMMUNITY): Payer: Medicare HMO

## 2023-04-22 ENCOUNTER — Ambulatory Visit: Payer: Medicare HMO

## 2023-04-24 DIAGNOSIS — E7849 Other hyperlipidemia: Secondary | ICD-10-CM | POA: Diagnosis not present

## 2023-04-24 DIAGNOSIS — E039 Hypothyroidism, unspecified: Secondary | ICD-10-CM | POA: Diagnosis not present

## 2023-04-24 DIAGNOSIS — E2740 Unspecified adrenocortical insufficiency: Secondary | ICD-10-CM | POA: Diagnosis not present

## 2023-04-24 DIAGNOSIS — Z95810 Presence of automatic (implantable) cardiac defibrillator: Secondary | ICD-10-CM | POA: Diagnosis not present

## 2023-04-24 DIAGNOSIS — E538 Deficiency of other specified B group vitamins: Secondary | ICD-10-CM | POA: Diagnosis not present

## 2023-04-24 DIAGNOSIS — N183 Chronic kidney disease, stage 3 unspecified: Secondary | ICD-10-CM | POA: Diagnosis not present

## 2023-04-24 DIAGNOSIS — I2510 Atherosclerotic heart disease of native coronary artery without angina pectoris: Secondary | ICD-10-CM | POA: Diagnosis not present

## 2023-04-24 DIAGNOSIS — I5020 Unspecified systolic (congestive) heart failure: Secondary | ICD-10-CM | POA: Diagnosis not present

## 2023-04-30 DIAGNOSIS — M79651 Pain in right thigh: Secondary | ICD-10-CM | POA: Diagnosis not present

## 2023-04-30 DIAGNOSIS — Z6829 Body mass index (BMI) 29.0-29.9, adult: Secondary | ICD-10-CM | POA: Diagnosis not present

## 2023-04-30 DIAGNOSIS — M5431 Sciatica, right side: Secondary | ICD-10-CM | POA: Diagnosis not present

## 2023-07-04 DIAGNOSIS — G3184 Mild cognitive impairment, so stated: Secondary | ICD-10-CM | POA: Diagnosis not present

## 2023-07-04 DIAGNOSIS — K5900 Constipation, unspecified: Secondary | ICD-10-CM | POA: Diagnosis not present

## 2023-07-04 DIAGNOSIS — I110 Hypertensive heart disease with heart failure: Secondary | ICD-10-CM | POA: Diagnosis not present

## 2023-07-04 DIAGNOSIS — I2510 Atherosclerotic heart disease of native coronary artery without angina pectoris: Secondary | ICD-10-CM | POA: Diagnosis not present

## 2023-07-04 DIAGNOSIS — G629 Polyneuropathy, unspecified: Secondary | ICD-10-CM | POA: Diagnosis not present

## 2023-07-04 DIAGNOSIS — M4800 Spinal stenosis, site unspecified: Secondary | ICD-10-CM | POA: Diagnosis not present

## 2023-07-04 DIAGNOSIS — M5440 Lumbago with sciatica, unspecified side: Secondary | ICD-10-CM | POA: Diagnosis not present

## 2023-07-04 DIAGNOSIS — M1990 Unspecified osteoarthritis, unspecified site: Secondary | ICD-10-CM | POA: Diagnosis not present

## 2023-07-04 DIAGNOSIS — J309 Allergic rhinitis, unspecified: Secondary | ICD-10-CM | POA: Diagnosis not present

## 2023-07-04 DIAGNOSIS — K219 Gastro-esophageal reflux disease without esophagitis: Secondary | ICD-10-CM | POA: Diagnosis not present

## 2023-07-04 DIAGNOSIS — E785 Hyperlipidemia, unspecified: Secondary | ICD-10-CM | POA: Diagnosis not present

## 2023-07-04 DIAGNOSIS — E039 Hypothyroidism, unspecified: Secondary | ICD-10-CM | POA: Diagnosis not present

## 2023-07-13 DIAGNOSIS — M79644 Pain in right finger(s): Secondary | ICD-10-CM | POA: Diagnosis not present

## 2023-07-13 DIAGNOSIS — S60466A Insect bite (nonvenomous) of right little finger, initial encounter: Secondary | ICD-10-CM | POA: Diagnosis not present

## 2023-08-06 DIAGNOSIS — E039 Hypothyroidism, unspecified: Secondary | ICD-10-CM | POA: Diagnosis not present

## 2023-08-06 DIAGNOSIS — E7849 Other hyperlipidemia: Secondary | ICD-10-CM | POA: Diagnosis not present

## 2023-08-06 DIAGNOSIS — I2510 Atherosclerotic heart disease of native coronary artery without angina pectoris: Secondary | ICD-10-CM | POA: Diagnosis not present

## 2023-08-06 DIAGNOSIS — Z95810 Presence of automatic (implantable) cardiac defibrillator: Secondary | ICD-10-CM | POA: Diagnosis not present

## 2023-08-06 DIAGNOSIS — Z9861 Coronary angioplasty status: Secondary | ICD-10-CM | POA: Diagnosis not present

## 2023-08-06 DIAGNOSIS — I420 Dilated cardiomyopathy: Secondary | ICD-10-CM | POA: Diagnosis not present

## 2023-08-06 DIAGNOSIS — I5022 Chronic systolic (congestive) heart failure: Secondary | ICD-10-CM | POA: Diagnosis not present

## 2023-08-06 DIAGNOSIS — I255 Ischemic cardiomyopathy: Secondary | ICD-10-CM | POA: Diagnosis not present

## 2023-08-06 DIAGNOSIS — R0602 Shortness of breath: Secondary | ICD-10-CM | POA: Diagnosis not present

## 2023-08-06 DIAGNOSIS — N183 Chronic kidney disease, stage 3 unspecified: Secondary | ICD-10-CM | POA: Diagnosis not present

## 2023-08-06 DIAGNOSIS — I252 Old myocardial infarction: Secondary | ICD-10-CM | POA: Diagnosis not present

## 2023-08-06 DIAGNOSIS — E2740 Unspecified adrenocortical insufficiency: Secondary | ICD-10-CM | POA: Diagnosis not present

## 2023-09-03 DIAGNOSIS — E7849 Other hyperlipidemia: Secondary | ICD-10-CM | POA: Diagnosis not present

## 2023-09-03 DIAGNOSIS — I2510 Atherosclerotic heart disease of native coronary artery without angina pectoris: Secondary | ICD-10-CM | POA: Diagnosis not present

## 2023-09-03 DIAGNOSIS — I429 Cardiomyopathy, unspecified: Secondary | ICD-10-CM | POA: Diagnosis not present

## 2023-09-03 DIAGNOSIS — I255 Ischemic cardiomyopathy: Secondary | ICD-10-CM | POA: Diagnosis not present

## 2023-09-03 DIAGNOSIS — I5020 Unspecified systolic (congestive) heart failure: Secondary | ICD-10-CM | POA: Diagnosis not present

## 2023-09-03 DIAGNOSIS — Z4502 Encounter for adjustment and management of automatic implantable cardiac defibrillator: Secondary | ICD-10-CM | POA: Diagnosis not present

## 2023-09-03 DIAGNOSIS — I252 Old myocardial infarction: Secondary | ICD-10-CM | POA: Diagnosis not present

## 2023-09-03 DIAGNOSIS — I5022 Chronic systolic (congestive) heart failure: Secondary | ICD-10-CM | POA: Diagnosis not present

## 2023-09-03 DIAGNOSIS — Z9861 Coronary angioplasty status: Secondary | ICD-10-CM | POA: Diagnosis not present

## 2023-09-03 DIAGNOSIS — I420 Dilated cardiomyopathy: Secondary | ICD-10-CM | POA: Diagnosis not present

## 2023-09-03 DIAGNOSIS — N183 Chronic kidney disease, stage 3 unspecified: Secondary | ICD-10-CM | POA: Diagnosis not present

## 2023-09-03 DIAGNOSIS — E2749 Other adrenocortical insufficiency: Secondary | ICD-10-CM | POA: Diagnosis not present

## 2023-09-03 DIAGNOSIS — Z95810 Presence of automatic (implantable) cardiac defibrillator: Secondary | ICD-10-CM | POA: Diagnosis not present

## 2023-09-03 DIAGNOSIS — E039 Hypothyroidism, unspecified: Secondary | ICD-10-CM | POA: Diagnosis not present

## 2023-09-12 DIAGNOSIS — E2749 Other adrenocortical insufficiency: Secondary | ICD-10-CM | POA: Diagnosis not present

## 2023-09-12 DIAGNOSIS — E039 Hypothyroidism, unspecified: Secondary | ICD-10-CM | POA: Diagnosis not present

## 2023-09-20 DIAGNOSIS — M542 Cervicalgia: Secondary | ICD-10-CM | POA: Diagnosis not present

## 2023-09-21 IMAGING — CT CT HIP*L* W/O CM
1 series · 15 of 32 positions shown, 19 images · non-contrast
Comparison: CT 02/16/2020.

CLINICAL DATA: Left hip pain, fractured pelvis

EXAM:
CT OF THE LEFT HIP WITHOUT CONTRAST
TECHNIQUE: Multidetector CT imaging of the left hip was performed according to
the standard protocol. Multiplanar CT image reconstructions were
also generated.
RADIATION DOSE REDUCTION: This exam was performed according to the
departmental dose-optimization program which includes automated
exposure control, adjustment of the mA and/or kV according to
patient size and/or use of iterative reconstruction technique.

[Series 5: soft tissue pelvis/hip · axial · 0.48mm/px · z∈[-280,-28]mm · 15 of 94 slices shown, 19 images]
[im 7/94  soft-tissue]
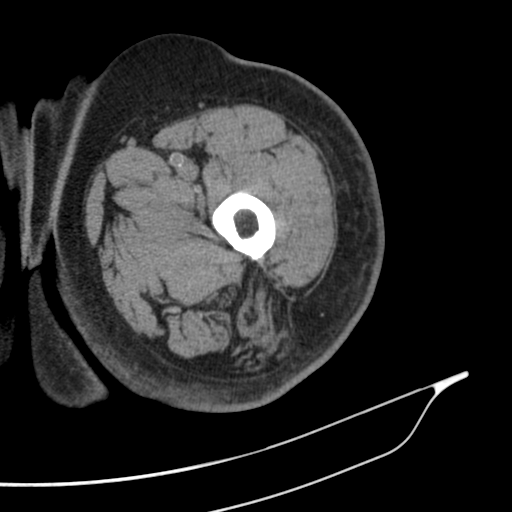
[im 7/94  bone]
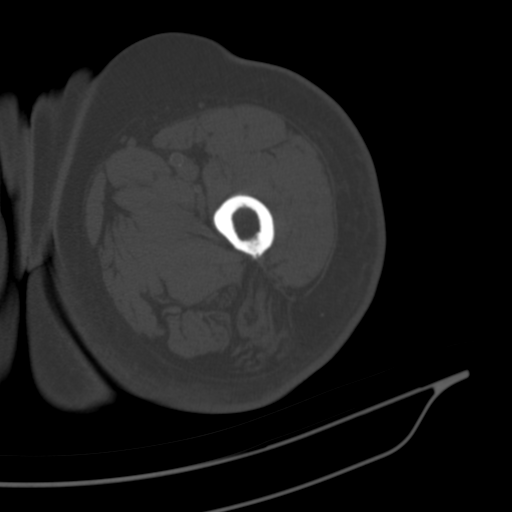
[im 13/94  soft-tissue]
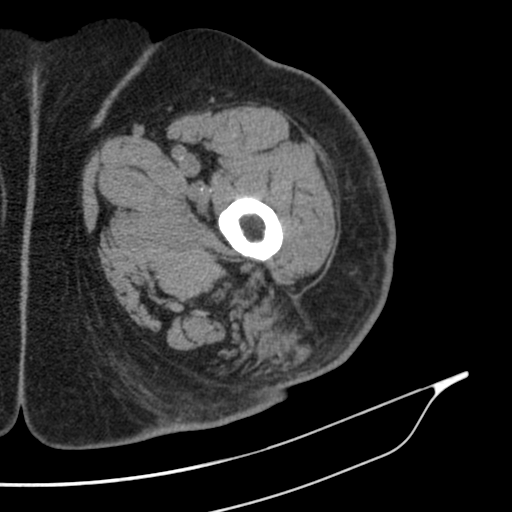
[im 19/94  soft-tissue]
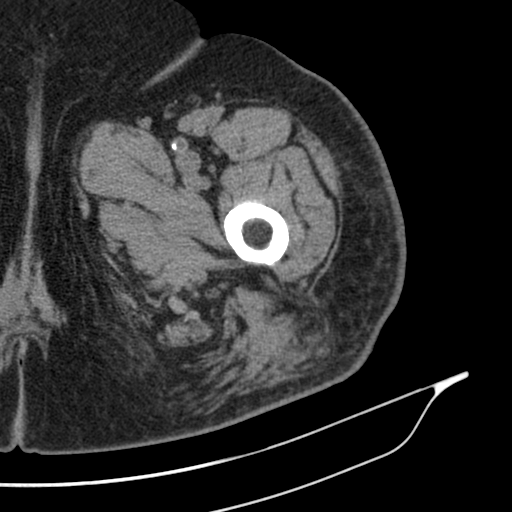
[im 28/94  soft-tissue]
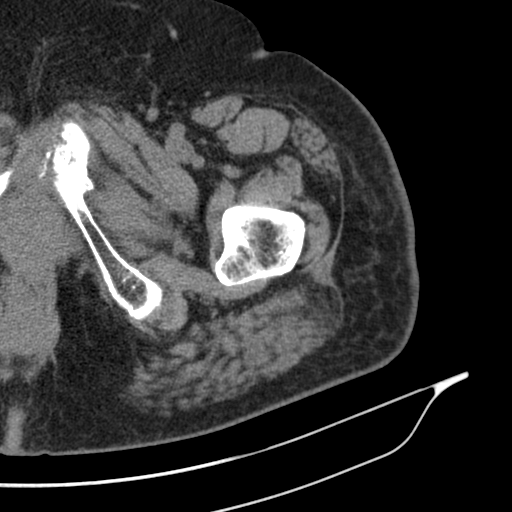
[im 34/94  soft-tissue]
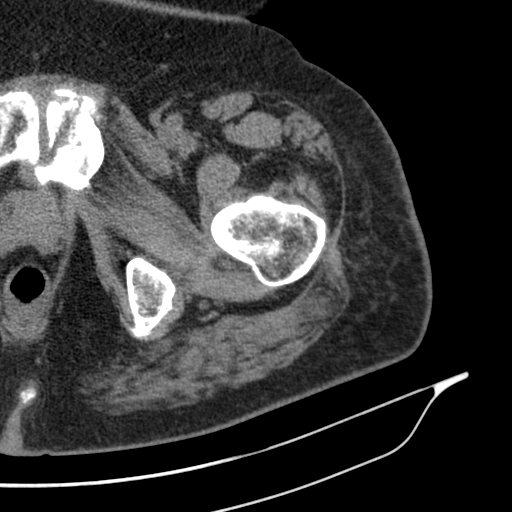
[im 40/94  soft-tissue]
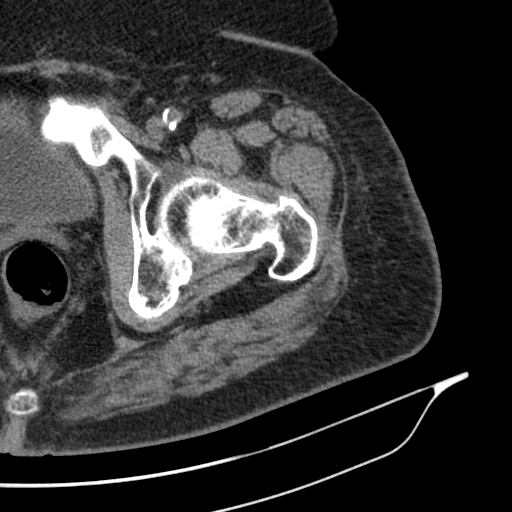
[im 49/94  soft-tissue]
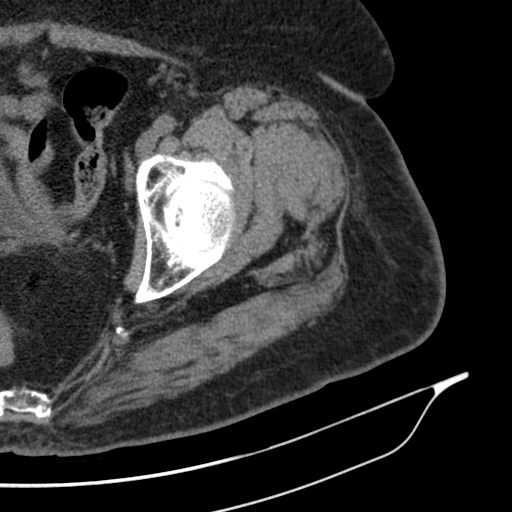
[im 55/94  soft-tissue]
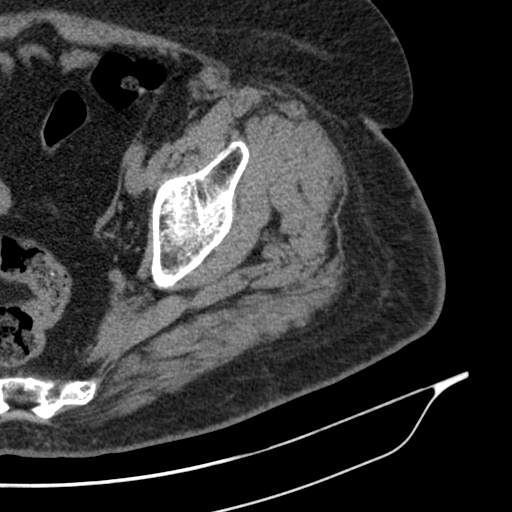
[im 61/94  soft-tissue]
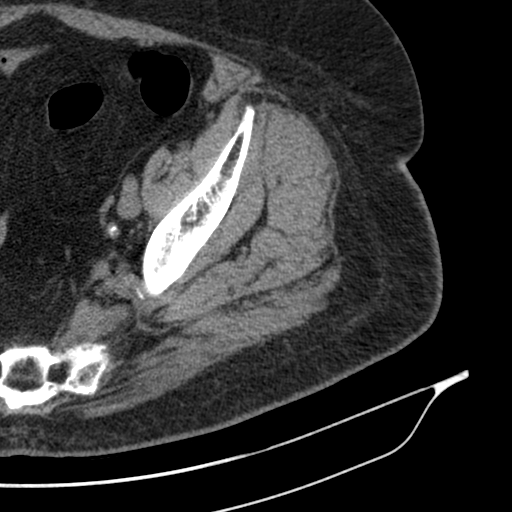
[im 61/94  bone]
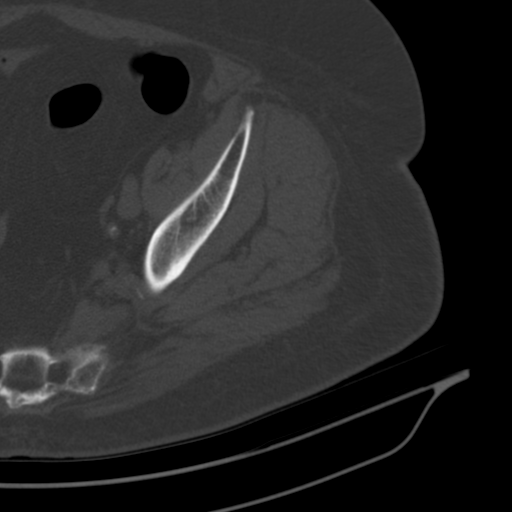
[im 67/94  soft-tissue]
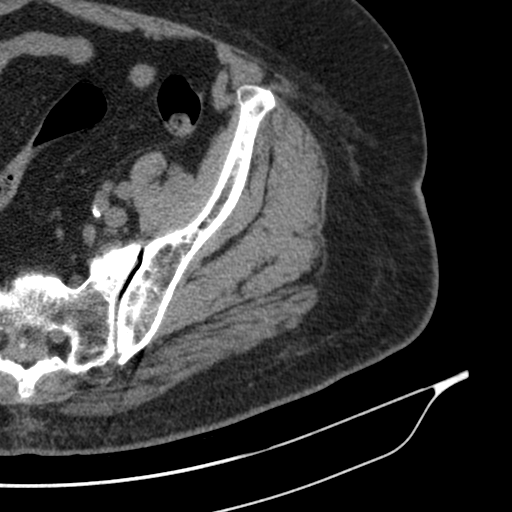
[im 76/94  soft-tissue]
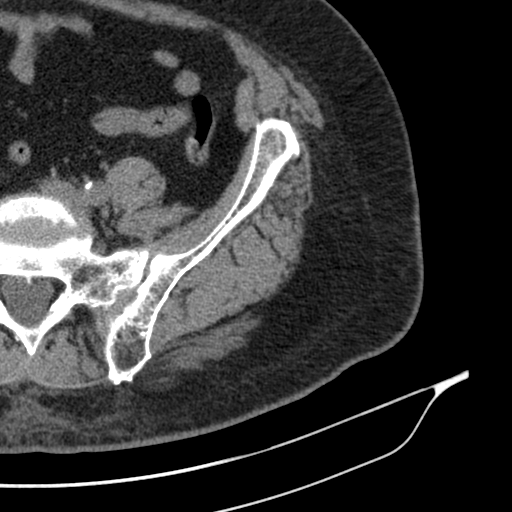
[im 82/94  soft-tissue]
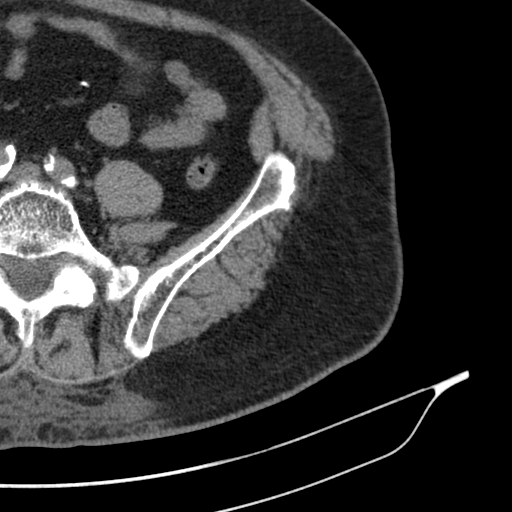
[im 82/94  lung]
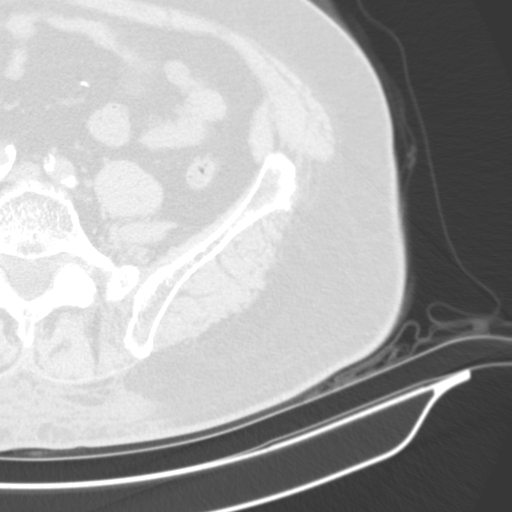
[im 85/94  lung]
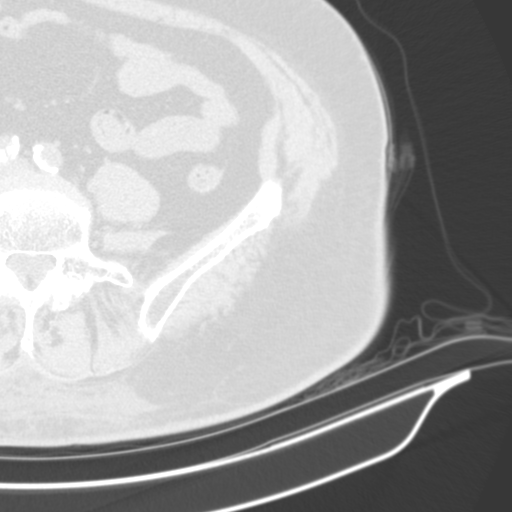
[im 88/94  soft-tissue]
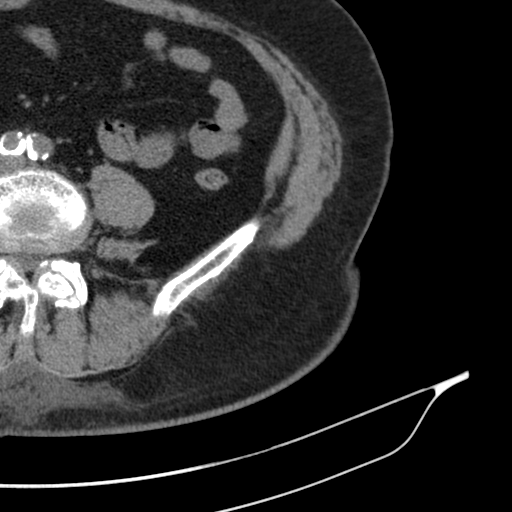
[im 88/94  lung]
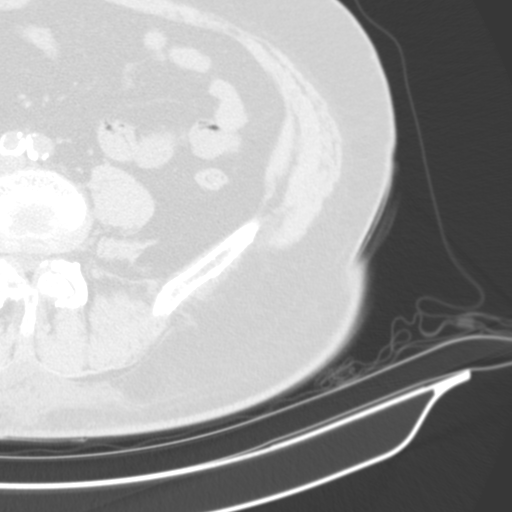
[im 91/94  lung]
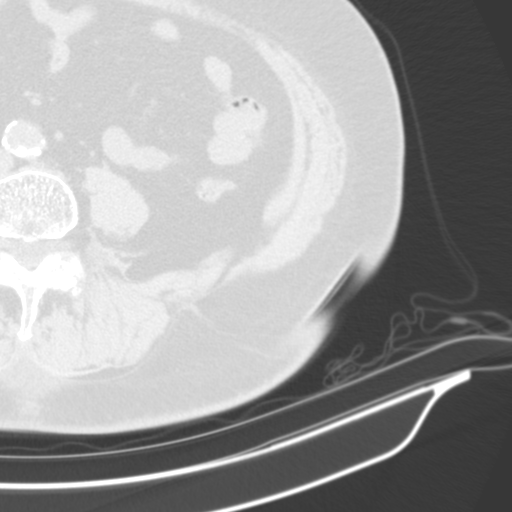

[15 of 32 positions shown; findings below may reference images not displayed]

FINDINGS: Bones/Joint/Cartilage

There is a chronic fracture of the left pubic bone with callus
formation but no solid osseous bridging. There is adjacent soft
tissue thickening at the symphysis pubis. Chronic essentially healed
fracture of the left sacral ala with small residual fracture line
anteriorly. There is no evidence of acute fracture. There is mild
left hip osteoarthritis. No aggressive osseous lesion.

Ligaments

Suboptimally assessed by CT.

Muscles and Tendons

No acute myotendinous abnormality on noncontrast CT. No
intramuscular collection.

Soft tissues

No focal fluid collection.
IMPRESSION: Chronic fracture of the left pubic bone at the pubic symphysis with
callus formation but no solid osseous bridging. Adjacent soft tissue
thickening.

Chronic essentially healed left sacral ala fracture with small
residual fracture line anteriorly.

Mild left hip osteoarthritis.  No acute osseous abnormality.

## 2023-10-17 DIAGNOSIS — I5020 Unspecified systolic (congestive) heart failure: Secondary | ICD-10-CM | POA: Diagnosis not present

## 2023-12-03 DIAGNOSIS — Z4502 Encounter for adjustment and management of automatic implantable cardiac defibrillator: Secondary | ICD-10-CM | POA: Diagnosis not present

## 2023-12-03 DIAGNOSIS — I5022 Chronic systolic (congestive) heart failure: Secondary | ICD-10-CM | POA: Diagnosis not present

## 2023-12-15 DIAGNOSIS — I5022 Chronic systolic (congestive) heart failure: Secondary | ICD-10-CM | POA: Diagnosis not present

## 2023-12-15 DIAGNOSIS — Z4502 Encounter for adjustment and management of automatic implantable cardiac defibrillator: Secondary | ICD-10-CM | POA: Diagnosis not present

## 2024-01-14 DIAGNOSIS — I082 Rheumatic disorders of both aortic and tricuspid valves: Secondary | ICD-10-CM | POA: Diagnosis not present

## 2024-01-14 DIAGNOSIS — I5020 Unspecified systolic (congestive) heart failure: Secondary | ICD-10-CM | POA: Diagnosis not present

## 2024-02-05 DIAGNOSIS — I2510 Atherosclerotic heart disease of native coronary artery without angina pectoris: Secondary | ICD-10-CM | POA: Diagnosis not present

## 2024-02-05 DIAGNOSIS — I252 Old myocardial infarction: Secondary | ICD-10-CM | POA: Diagnosis not present

## 2024-02-05 DIAGNOSIS — E7849 Other hyperlipidemia: Secondary | ICD-10-CM | POA: Diagnosis not present

## 2024-02-05 DIAGNOSIS — I5020 Unspecified systolic (congestive) heart failure: Secondary | ICD-10-CM | POA: Diagnosis not present

## 2024-02-05 DIAGNOSIS — I5022 Chronic systolic (congestive) heart failure: Secondary | ICD-10-CM | POA: Diagnosis not present

## 2024-02-05 DIAGNOSIS — I420 Dilated cardiomyopathy: Secondary | ICD-10-CM | POA: Diagnosis not present

## 2024-02-05 DIAGNOSIS — I255 Ischemic cardiomyopathy: Secondary | ICD-10-CM | POA: Diagnosis not present

## 2024-02-18 DIAGNOSIS — R110 Nausea: Secondary | ICD-10-CM | POA: Diagnosis not present

## 2024-02-18 DIAGNOSIS — R55 Syncope and collapse: Secondary | ICD-10-CM | POA: Diagnosis not present

## 2024-02-19 DIAGNOSIS — R5383 Other fatigue: Secondary | ICD-10-CM | POA: Diagnosis not present

## 2024-02-24 DIAGNOSIS — M533 Sacrococcygeal disorders, not elsewhere classified: Secondary | ICD-10-CM | POA: Diagnosis not present

## 2024-02-24 DIAGNOSIS — K219 Gastro-esophageal reflux disease without esophagitis: Secondary | ICD-10-CM | POA: Diagnosis not present

## 2024-02-24 DIAGNOSIS — E039 Hypothyroidism, unspecified: Secondary | ICD-10-CM | POA: Diagnosis not present

## 2024-02-24 DIAGNOSIS — I519 Heart disease, unspecified: Secondary | ICD-10-CM | POA: Diagnosis not present

## 2024-02-24 DIAGNOSIS — Z0000 Encounter for general adult medical examination without abnormal findings: Secondary | ICD-10-CM | POA: Diagnosis not present

## 2024-03-12 DIAGNOSIS — E039 Hypothyroidism, unspecified: Secondary | ICD-10-CM | POA: Diagnosis not present

## 2024-03-12 DIAGNOSIS — E2749 Other adrenocortical insufficiency: Secondary | ICD-10-CM | POA: Diagnosis not present

## 2024-04-01 DIAGNOSIS — N3000 Acute cystitis without hematuria: Secondary | ICD-10-CM | POA: Diagnosis not present

## 2024-04-01 DIAGNOSIS — R112 Nausea with vomiting, unspecified: Secondary | ICD-10-CM | POA: Diagnosis not present

## 2024-06-02 DIAGNOSIS — Z95810 Presence of automatic (implantable) cardiac defibrillator: Secondary | ICD-10-CM | POA: Diagnosis not present

## 2024-07-04 DIAGNOSIS — I252 Old myocardial infarction: Secondary | ICD-10-CM | POA: Diagnosis not present

## 2024-07-04 DIAGNOSIS — N183 Chronic kidney disease, stage 3 unspecified: Secondary | ICD-10-CM | POA: Diagnosis not present

## 2024-07-04 DIAGNOSIS — R001 Bradycardia, unspecified: Secondary | ICD-10-CM | POA: Diagnosis not present

## 2024-07-04 DIAGNOSIS — R110 Nausea: Secondary | ICD-10-CM | POA: Diagnosis not present

## 2024-07-04 DIAGNOSIS — I2109 ST elevation (STEMI) myocardial infarction involving other coronary artery of anterior wall: Secondary | ICD-10-CM | POA: Diagnosis not present

## 2024-07-04 DIAGNOSIS — Z95810 Presence of automatic (implantable) cardiac defibrillator: Secondary | ICD-10-CM | POA: Diagnosis not present

## 2024-07-04 DIAGNOSIS — S0003XA Contusion of scalp, initial encounter: Secondary | ICD-10-CM | POA: Diagnosis not present

## 2024-07-04 DIAGNOSIS — I130 Hypertensive heart and chronic kidney disease with heart failure and stage 1 through stage 4 chronic kidney disease, or unspecified chronic kidney disease: Secondary | ICD-10-CM | POA: Diagnosis not present

## 2024-07-04 DIAGNOSIS — Z743 Need for continuous supervision: Secondary | ICD-10-CM | POA: Diagnosis not present

## 2024-07-04 DIAGNOSIS — I5022 Chronic systolic (congestive) heart failure: Secondary | ICD-10-CM | POA: Diagnosis not present

## 2024-07-04 DIAGNOSIS — I951 Orthostatic hypotension: Secondary | ICD-10-CM | POA: Diagnosis not present

## 2024-07-04 DIAGNOSIS — Z87891 Personal history of nicotine dependence: Secondary | ICD-10-CM | POA: Diagnosis not present

## 2024-07-04 DIAGNOSIS — Z79899 Other long term (current) drug therapy: Secondary | ICD-10-CM | POA: Diagnosis not present

## 2024-07-04 DIAGNOSIS — W19XXXA Unspecified fall, initial encounter: Secondary | ICD-10-CM | POA: Diagnosis not present

## 2024-07-04 DIAGNOSIS — R42 Dizziness and giddiness: Secondary | ICD-10-CM | POA: Diagnosis not present

## 2024-07-04 DIAGNOSIS — I2510 Atherosclerotic heart disease of native coronary artery without angina pectoris: Secondary | ICD-10-CM | POA: Diagnosis not present

## 2024-07-05 DIAGNOSIS — I951 Orthostatic hypotension: Secondary | ICD-10-CM | POA: Diagnosis not present

## 2024-07-05 DIAGNOSIS — R55 Syncope and collapse: Secondary | ICD-10-CM | POA: Diagnosis not present

## 2024-07-05 DIAGNOSIS — J984 Other disorders of lung: Secondary | ICD-10-CM | POA: Diagnosis not present

## 2024-07-05 DIAGNOSIS — R9431 Abnormal electrocardiogram [ECG] [EKG]: Secondary | ICD-10-CM | POA: Diagnosis not present

## 2024-07-05 DIAGNOSIS — R918 Other nonspecific abnormal finding of lung field: Secondary | ICD-10-CM | POA: Diagnosis not present

## 2024-07-05 DIAGNOSIS — I252 Old myocardial infarction: Secondary | ICD-10-CM | POA: Diagnosis not present

## 2024-07-05 DIAGNOSIS — R0989 Other specified symptoms and signs involving the circulatory and respiratory systems: Secondary | ICD-10-CM | POA: Diagnosis not present

## 2024-07-20 DIAGNOSIS — N183 Chronic kidney disease, stage 3 unspecified: Secondary | ICD-10-CM | POA: Diagnosis not present

## 2024-07-20 DIAGNOSIS — I119 Hypertensive heart disease without heart failure: Secondary | ICD-10-CM | POA: Diagnosis not present

## 2024-09-01 DIAGNOSIS — Z95810 Presence of automatic (implantable) cardiac defibrillator: Secondary | ICD-10-CM | POA: Diagnosis not present

## 2024-09-10 DIAGNOSIS — S61121A Laceration with foreign body of right thumb with damage to nail, initial encounter: Secondary | ICD-10-CM | POA: Diagnosis not present

## 2024-09-10 DIAGNOSIS — M79645 Pain in left finger(s): Secondary | ICD-10-CM | POA: Diagnosis not present
